# Patient Record
Sex: Female | Born: 1940 | Race: White | Hispanic: No | Marital: Married | Smoking: Never smoker
Health system: Southern US, Community
[De-identification: ages and names within clinical notes are randomized; demographics above are authoritative.]

## PROBLEM LIST (undated history)

## (undated) DIAGNOSIS — Z950 Presence of cardiac pacemaker: Secondary | ICD-10-CM

## (undated) DIAGNOSIS — N200 Calculus of kidney: Secondary | ICD-10-CM

## (undated) DIAGNOSIS — I4891 Unspecified atrial fibrillation: Secondary | ICD-10-CM

## (undated) DIAGNOSIS — I493 Ventricular premature depolarization: Secondary | ICD-10-CM

## (undated) DIAGNOSIS — R55 Syncope and collapse: Secondary | ICD-10-CM

## (undated) DIAGNOSIS — L989 Disorder of the skin and subcutaneous tissue, unspecified: Secondary | ICD-10-CM

## (undated) DIAGNOSIS — I4510 Unspecified right bundle-branch block: Secondary | ICD-10-CM

## (undated) DIAGNOSIS — I10 Essential (primary) hypertension: Secondary | ICD-10-CM

## (undated) DIAGNOSIS — F419 Anxiety disorder, unspecified: Secondary | ICD-10-CM

## (undated) DIAGNOSIS — I495 Sick sinus syndrome: Secondary | ICD-10-CM

## (undated) DIAGNOSIS — M1990 Unspecified osteoarthritis, unspecified site: Secondary | ICD-10-CM

## (undated) HISTORY — DX: Unspecified osteoarthritis, unspecified site: M19.90

## (undated) HISTORY — DX: Unspecified atrial fibrillation: I48.91

## (undated) HISTORY — DX: Syncope and collapse: R55

## (undated) HISTORY — DX: Disorder of the skin and subcutaneous tissue, unspecified: L98.9

## (undated) HISTORY — DX: Ventricular premature depolarization: I49.3

## (undated) HISTORY — PX: TONSILLECTOMY: SUR1361

## (undated) HISTORY — DX: Essential (primary) hypertension: I10

## (undated) HISTORY — DX: Calculus of kidney: N20.0

## (undated) HISTORY — DX: Anxiety disorder, unspecified: F41.9

## (undated) HISTORY — DX: Unspecified right bundle-branch block: I45.10

## (undated) HISTORY — PX: COLONOSCOPY: SHX174

---

## 2009-05-30 ENCOUNTER — Emergency Department (HOSPITAL_COMMUNITY): Admission: EM | Admit: 2009-05-30 | Discharge: 2009-05-30 | Payer: Self-pay | Admitting: Emergency Medicine

## 2009-08-10 ENCOUNTER — Ambulatory Visit: Payer: Self-pay | Admitting: Internal Medicine

## 2009-11-02 ENCOUNTER — Ambulatory Visit: Payer: Self-pay | Admitting: Internal Medicine

## 2010-03-12 ENCOUNTER — Ambulatory Visit: Payer: Self-pay | Admitting: Internal Medicine

## 2010-12-02 LAB — URINALYSIS, ROUTINE W REFLEX MICROSCOPIC
Bilirubin Urine: NEGATIVE
Glucose, UA: NEGATIVE mg/dL
Hgb urine dipstick: NEGATIVE
Specific Gravity, Urine: 1.009 (ref 1.005–1.030)
Urobilinogen, UA: 0.2 mg/dL (ref 0.0–1.0)

## 2010-12-02 LAB — BASIC METABOLIC PANEL
BUN: 20 mg/dL (ref 6–23)
Calcium: 8.5 mg/dL (ref 8.4–10.5)
Chloride: 107 mEq/L (ref 96–112)
Creatinine, Ser: 1.09 mg/dL (ref 0.4–1.2)
GFR calc Af Amer: >60 mL/min (ref >60)

## 2010-12-02 LAB — CROSSMATCH

## 2010-12-02 LAB — URINE MICROSCOPIC-ADD ON

## 2010-12-02 LAB — POCT CARDIAC MARKERS
CKMB, poc: 1.6 ng/mL (ref 1.0–8.0)
Myoglobin, poc: 108 ng/mL (ref 12–200)
Troponin i, poc: <0.05 ng/mL (ref 0.00–0.09)

## 2010-12-02 LAB — DIFFERENTIAL
Basophils Relative: 0 % (ref 0–1)
Lymphocytes Relative: 17 % (ref 12–46)
Lymphs Abs: 2 10*3/uL (ref 0.7–4.0)
Monocytes Absolute: 1 10*3/uL (ref 0.1–1.0)

## 2010-12-02 LAB — CBC
Platelets: 223 10*3/uL (ref 150–400)
RDW: 12.5 % (ref 11.5–15.5)
WBC: 12.1 10*3/uL — ABNORMAL HIGH (ref 4.0–10.5)

## 2010-12-02 LAB — ABO/RH: ABO/RH(D): A POS

## 2010-12-02 LAB — BRAIN NATRIURETIC PEPTIDE: Pro B Natriuretic peptide (BNP): 128 pg/mL — ABNORMAL HIGH (ref 0.0–100.0)

## 2011-01-11 NOTE — Letter (Signed)
March 12, 2010    Dr. Duffy Rhody  [address]  [place], [place]  [address]   RE:  Emily, [name]  MRN:  [id number]  /  DOB:  04/05/1941   Dear Peyton Najjar:   I hope this letter finds you well.  Emily [name] is seen today in  follow-up for palpitations which are presumed to be attributable to the  PVCs as well as a history of atrial fibrillation and hypertension.   She has been doing exceedingly well.  She has had very few palpitations.  Her blood pressure is much better at home running about 130.  One day  she had it 110, and she felt a little bit sluggish.  The regime change  at her last visit was to introduce hydrochlorothiazide.  We also sought  to reduce her Atenolol and put her on diltiazem.  She did not undertake  the latter, so she remains on atenolol 50 b.i.d., lisinopril HCTZ at  12.5, and she used diltiazem 30 p.r.n. for recurrent palpitations.  She  is also on aspirin.   EXAMINATION:  Today her blood pressure is a little higher at 140/80.  Her weight was 153.  Her pulse was 62.  Her neck veins were flat.  LUNGS:  Clear.  HEART:  Sounds were regular with an S4.  ABDOMEN:  Soft.  EXTREMITIES:  Without edema.   IMPRESSION:  1. Atrial fibrillation.  2. Hypertension.  3. 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  4. Thromboembolic risk factors notable for age, gender, and      hypertension.   DISCUSSION:  Emily [name] is doing well.  I am glad you checked her  blood work because her potassium needs to be reviewed since the  introduction of the diuretic.  I have advised her to take her atenolol  once a day in the event that she continues to have problems with  relatively low a.m. blood pressure and to take it in the evening.   The issue of oral anticoagulation therapy for thromboembolic risk  reduction was addressed previously, and at that point she remains  reluctant to do that.  We will plan to review this in 6 months, and we  will address that again.   Thanks for allowing [place]  to participate in her care.    Sincerely,      Duke Salvia, MD, [facility]  Electronically Signed    SCK/MedQ  DD: 03/12/2010  DT: 03/12/2010  Job #: [id number]

## 2011-01-11 NOTE — Letter (Signed)
August 10, 2009    Sharyne Peach, MD  [address]  [place], [place]  [address]   RE:  Emily [name], Emily [name]  MRN:  [id number]  /  DOB:  1941/01/15   Dear Dr. Steva Ready,   I hope this letter finds you and your family well at Christmas time.   It was a pleasure seeing Emily [name] at your request (and hers) for  recurrent atrial arrhythmias.   She is a 70 year old woman who has a history of tachy palpitations that  she says dates back about 50 years.  She thinks that maybe they were  regular in the beginning but over the last 5-10 years, she has had  recurrent irregular palpitations.  At some point, somebody told her that  she had atrial fibrillation although I do not know at what point it was  documented until the emergency room just about 2 months ago.  I was able  to track down these electrocardiograms and review them and indeed, she  had atrial fibrillation initially with a rapid rate and then with a more  controlled rate.   She has had recurrent episodes of these tachy palpitations as well as  something else called spells.  It is not clear whether they are the  same thing.  They are associated with some lightheadedness and some  sensation of fluttering but no change in her exercise tolerance.  There  was no shortness of breath, lightheadedness, or chest discomfort.   A couple of years ago, she was not sure of the date when this was first  identified, I think it was about 2003, she ended up seeing [facility] and had a stress test and an echo, both of which she said  were normal.  Again, those data are not available.   Laboratories that you drew that she brought with her today demonstrated  an HDL of 38, a triglyceride of 187, and an LDL of 92.  These labs were  drawn at 1430, so I do not think they were fasting.   PAST MEDICAL HISTORY:  In addition to the above is notable for anxiety  and hypertension.   Her thromboembolic risk factors are notable for age, hypertension,  and  gender.   PAST SURGICAL HISTORY:  Negative.   REVIEW OF SYSTEMS:  Broadly negative across multiple organ systems apart  from what is listed above.   MEDICATIONS:  1. Atenolol 50 b.i.d.  2. Lisinopril 30.  3. Diltiazem 30 p.r.n.  4. Lorazepam 1 b.i.d.  5. Aspirin 325.  She notes that she has been previously spoken to about using Coumadin;  she had declined in the past.  She also has a prescription for once a  day diltiazem, which she is not taking.   She is allergic to PENICILLIN.   PHYSICAL EXAMINATION:  GENERAL:  She is an older Caucasian female  appearing her stated age of 49 although she may look somewhat younger  than this.  She is in no acute distress.  VITAL SIGNS:  Her blood pressure was 161/84, pulse was 73, and her  weight was 158.  HEENT:  Normal.  NECK:  There is no lymphadenopathy.  Carotids are brisk and full  bilaterally without bruits; neck veins were flat.  BACK:  Without kyphosis or scoliosis.  LUNGS:  Clear.  There is no CVA tenderness.  HEART:  Sounds were regular with an S4.  There was an early systolic  murmur.  ABDOMEN:  Soft with active bowel sounds  without midline pulsation or  hepatomegaly.  EXTREMITIES:  Femoral pulses were 2+.  Distal pulses were intact.  There  was no clubbing, cyanosis, or edema.  NEUROLOGIC:  Grossly normal.  SKIN:  Warm and dry.   I should note also that her family history is noncontributory.   SOCIAL HISTORY:  She continues to work at [employer].  She  is married.  She has 3 children, 9 grandchildren, 2 great-grandchildren.  She smokes but does not currently.  She drinks occasional wine and uses  some caffeine.   Electrocardiogram obtained today demonstrated a sinus rhythm at 77 with  intervals of 0.16/0.11/0.38.  The axis was mildly leftward at -6.   IMPRESSION:  1. Paroxysmal atrial fibrillation.  2. Thromboembolic risk factors notable for;      a.     Age.      b.     Gender.      c.      Hypertension.  3. Incomplete right bundle-branch block.   Dr. Steva Ready, Ms. Slappey has paroxysmal atrial fibrillation.  It is not  all that bothersome to her except that it has been disconcerting.  We  discussed management of symptoms and your p.r.n. diltiazem has had a big  beneficial impact on this.   I think the biggest issue is thromboembolic risk reduction.  Based on  the CHADS-VASc algorithm, she has a score of 3 (hypertension - 1, age -  1, gender - 1).  As such, I would recommend that she be on Coumadin.  Alternatively, she could be placed on dabigatran.  She is not yet ready  to pursue oral anticoagulation.   She did ask about catheter ablation.  I told that her it was  appropriately reserved for people who have failed antiarrhythmic drug  therapy given the risk of recurrence and not inconsequential risk  profile.  Further anticoagulation would be an important part of that  therapeutic pursuit.   We did decide to revisit this problem in 3 months.  She is to consider  oral anticoagulation in the interim.  She will continue to use her  diltiazem p.r.n. in conjunction with her atenolol, and I did refill her  atenolol, lisinopril prescriptions.    Sincerely,      Duke Salvia, MD, [facility]  Electronically Signed    SCK/MedQ  DD: 08/10/2009  DT: 08/11/2009  Job #: [id number]

## 2011-01-11 NOTE — Letter (Signed)
November 02, 2009    Dr. Brent Bulla  [address]  [place], [place] [address]   RE:  Emily, [name]  MRN:  [id number]  /  DOB:  April 20, 1941   Dear Dr. Marina Goodell:   Welcome to the [place] area.  Hopefully, it has been an easy move.  [name] came back in for her palpitations which were previously  identified and did have some atrial fibrillation.  She has a  longstanding history of palpitations so undoubtedly there are 2  different mechanisms.   She has had some of this since I saw her last a couple of months ago.  These were rapidly quieted by p.r.n. diltiazem.   She has noted also that her blood pressures are quite elevated at 160 or  so in the morning.  She, on her own, increased her lisinopril from 20 to  40 mg a day.  Her other medications include atenolol 50 b.i.d., aspirin,  diltiazem, and fish oil.   EXAMINATION:  Her blood pressure is 167/87.  Her pulse was 77.  Her  weight was 155, down 3 pounds.  LUNGS:  Clear.  NECK:  Veins were flat.  Her carotids were brisk.  HEART:  Sounds were regular.  EXTREMITIES:  Without edema.   IMPRESSION:  1. Hypertension.  2. Atrial fibrillation.  3. Palpitations with skips, probably premature ventricular      contractions.   We had a lengthy discussion regarding salt and the importance of  decreasing salt intake.  We also had a discussion regarding treatment  options for her atrial fibrillation and her hypertension and other  palpitations.   What we have elected to do was to resume her lisinopril 20 mg a day and  add hydrochlorothiazide at 12.5 mg a day to this.  In addition, because  of the apparent benefit of the calcium blocker, we will begin to take it  daily at 120 mg a day and decrease her atenolol at the same time from 50  mg twice daily to once a day.   She will work further on decreasing her salt intake.   We will see her again in 4 months.  If there is anything I can do,  please do not hesitate to contact me.    Sincerely,      Duke Salvia, MD, [facility]  Electronically Signed    SCK/MedQ  DD: 11/02/2009  DT: 11/02/2009  Job #: [id number]

## 2011-02-10 ENCOUNTER — Encounter: Payer: Self-pay | Admitting: Internal Medicine

## 2011-02-14 ENCOUNTER — Ambulatory Visit (INDEPENDENT_AMBULATORY_CARE_PROVIDER_SITE_OTHER): Payer: 59 | Admitting: Internal Medicine

## 2011-02-14 DIAGNOSIS — I4949 Other premature depolarization: Secondary | ICD-10-CM

## 2011-02-14 DIAGNOSIS — I4891 Unspecified atrial fibrillation: Secondary | ICD-10-CM

## 2011-02-15 ENCOUNTER — Other Ambulatory Visit: Payer: Self-pay | Admitting: *Deleted

## 2011-02-15 DIAGNOSIS — I4891 Unspecified atrial fibrillation: Secondary | ICD-10-CM

## 2011-02-15 DIAGNOSIS — R002 Palpitations: Secondary | ICD-10-CM

## 2011-02-15 NOTE — Letter (Signed)
February 14, 2011   Duffy Rhody, MD [address] [place], [place] [address]  RE:  Emily [name], Emily [name] MRN:  [id number]  /  DOB:  [birth year]  Dear Peyton Najjar:  I hope this letter finds you well.  [name] is seen today in followup for palpitations which are presumed to be PVCs as well as a history of atrial fibrillation in the context of hypertension.  She continues to do very well.  She has occasional palpitations.  He has learned just to relax and then pass which they do within a minute or two.  She continues to be very fit.  She pushes her long more 4 hours a day in the sun without problems of chest pain or shortness of breath.  Her medications currently include atenolol 50 b.i.d., aspirin 325, diltiazem 30 p.r.n., hydrochlorothiazide 12.5, lisinopril 30.  On examination today, her blood pressure was 133/72, her pulse was 80. Her lungs were clear.  Heart sounds were regular.  Neck veins were flat. The carotids are brisk.  The extremities were without edema.  She is alert and oriented.  The skin looks warm and dry.  Peripheral electrocardiogram demonstrated today demonstrated sinus rhythm with right bundle-branch block with intervals of 0.17/0.11/0.40. Otherwise normal.  IMPRESSION: 1. Atrial fibrillation. 2. Thromboembolic risk factors notable for age, gender and     hypertension. 3. Premature ventricular contractions.  [name] is doing really very well.  I have told her that in fall we expect approval of the apixaban.  The advantage of this is that it is risk profile was quite similar to aspirin, but its benefit profile is greater than warfarin.  She is agreeable to thinking about that.   Sincerely,     Duke Salvia, MD, [facility]   SCK/MedQ  DD: 02/14/2011  DT: 02/15/2011  Job #: [id number]

## 2011-02-15 NOTE — Letter (Deleted)
February 14, 2011   Peyton Najjar A. Freida Busman, MD [address], [address] [place], [place] [address]  RE:  Emily [name], Emily [name] MRN:  [id number]  /  DOB:  1941-08-28  Dear Peyton Najjar:  I hope this letter finds you well.  [name] is seen today in followup for palpitations which are presumed to be PVCs as well as a history of atrial fibrillation in the context of hypertension.  She continues to do very well.  She has occasional palpitations.  He has learned just to relax and then pass which they do within a minute or two.  She continues to be very fit.  She pushes her long more 4 hours a day in the sun without problems of chest pain or shortness of breath.  Her medications currently include atenolol 50 b.i.d., aspirin 325, diltiazem 30 p.r.n., hydrochlorothiazide 12.5, lisinopril 30.  On examination today, her blood pressure was 133/72, her pulse was 80. Her lungs were clear.  Heart sounds were regular.  Neck veins were flat. The carotids are brisk.  The extremities were without edema.  She is alert and oriented.  The skin looks warm and dry.  Peripheral electrocardiogram demonstrated today demonstrated sinus rhythm with right bundle-branch block with intervals of 0.17/0.11/0.40. Otherwise normal.  IMPRESSION: 1. Atrial fibrillation. 2. Thromboembolic risk factors notable for age, gender and     hypertension. 3. Premature ventricular contractions.  [name] is doing really very well.  I have told her that in fall we expect approval of the apixaban.  The advantage of this is that it is risk profile was quite similar to aspirin, but its benefit profile is greater than warfarin.  She is agreeable to think about that.   Sincerely,     Duke Salvia, MD, [facility]   SCK/MedQ  DD: 02/14/2011  DT: 02/15/2011  Job #: [id number]

## 2011-03-11 ENCOUNTER — Encounter: Payer: Self-pay | Admitting: Cardiovascular Disease

## 2011-04-29 ENCOUNTER — Other Ambulatory Visit: Payer: Self-pay | Admitting: Internal Medicine

## 2011-09-02 ENCOUNTER — Encounter: Payer: Self-pay | Admitting: Internal Medicine

## 2011-09-02 ENCOUNTER — Ambulatory Visit (INDEPENDENT_AMBULATORY_CARE_PROVIDER_SITE_OTHER): Payer: Medicare Other | Admitting: Internal Medicine

## 2011-09-02 DIAGNOSIS — I10 Essential (primary) hypertension: Secondary | ICD-10-CM | POA: Insufficient documentation

## 2011-09-02 DIAGNOSIS — I4949 Other premature depolarization: Secondary | ICD-10-CM

## 2011-09-02 DIAGNOSIS — I493 Ventricular premature depolarization: Secondary | ICD-10-CM | POA: Insufficient documentation

## 2011-09-02 DIAGNOSIS — I4510 Unspecified right bundle-branch block: Secondary | ICD-10-CM | POA: Insufficient documentation

## 2011-09-02 DIAGNOSIS — I4891 Unspecified atrial fibrillation: Secondary | ICD-10-CM | POA: Insufficient documentation

## 2011-09-02 NOTE — Progress Notes (Signed)
  HPI  Emily [name] is a 71 y.o. female Seen in followup for documented atrial fibrillation (ECGs I reviewed from [facility] 2010) and symptomatic PVCs. She continues to have infrequent but recurring episodes of atrial fibrillation. She takes a  Cardizem or 2 with a subsequent reversion to regular rhythm  Her thromboembolic risk profile includes hypertension gender and age; she has been reluctant to pursue oral i anticoagulation  Past Medical History  Diagnosis Date  . Campath-induced atrial fibrillation   . HTN (hypertension)   . Palpitations   . PVC's (premature ventricular contractions)     Past Surgical History  Procedure Date  . None     Current Outpatient Prescriptions  Medication Sig Dispense Refill  . aspirin 325 MG tablet Take 325 mg by mouth daily.        Marland Kitchen atenolol (TENORMIN) 50 MG tablet Take 50 mg by mouth 2 (two) times daily.       Marland Kitchen diltiazem (CARDIZEM) 30 MG tablet Take 30 mg by mouth as needed.        . hydrochlorothiazide (,MICROZIDE/HYDRODIURIL,) 12.5 MG capsule Take 12.5 mg by mouth daily.        Marland Kitchen lisinopril (PRINIVIL,ZESTRIL) 30 MG tablet Take 30 mg by mouth daily.        Marland Kitchen LORazepam (ATIVAN) 1 MG tablet Take 1 mg by mouth 2 (two) times daily.        . Multiple Vitamin (MULTIVITAMIN) tablet Take 1 tablet by mouth daily.        . Multiple Vitamins-Minerals (EYE VITAMINS) CAPS Take by mouth daily.          Allergies  Allergen Reactions  . Penicillins     Review of Systems negative except from HPI and PMH  Physical Exam Well developed and well nourished in no acute distress HENT normal E scleral and icterus clear Neck Supple JVP flat; carotids brisk and full Clear to ausculation regular rate and rhythm Regular rate and rhythm, no murmurs gallops or rub Soft with active bowel sounds No clubbing cyanosis none Edema Alert and oriented, grossly normal motor and sensory function Skin Warm and Dry  ECG demonstrates sinus rhythm at  66 Intervals 0.17/0.12/0.44 There is right bundle branch block and modest ST segment changes in the inferior leads  Assessment and  Plan

## 2011-09-02 NOTE — Assessment & Plan Note (Addendum)
The patient has paroxysmal atrial fibrillation. We have again reviewed the data regarding oral anticoagulation. The new drug, Apixoban, has just been released. She will consider her transitioning from aspirin . She is not yet ready. In the interim I've asked her to decrease her aspirin from 325-81.  We discussed risks and benefits related to bleeding and stroke risk reduction.

## 2011-09-02 NOTE — Patient Instructions (Signed)
Your physician wants you to follow-up in: 6 months with Dr. Graciela Husbands. You will receive a reminder letter in the mail two months in advance. If you don't receive a letter, please call our office to schedule the follow-up appointment.  Your physician has recommended you make the following change in your medication:  1) Decrease aspirin to 81 mg once daily.

## 2011-09-02 NOTE — Assessment & Plan Note (Signed)
Well- controlled on current medication 

## 2012-03-20 ENCOUNTER — Ambulatory Visit (INDEPENDENT_AMBULATORY_CARE_PROVIDER_SITE_OTHER): Payer: Medicare Other | Admitting: Internal Medicine

## 2012-03-20 ENCOUNTER — Encounter: Payer: Self-pay | Admitting: Internal Medicine

## 2012-03-20 VITALS — BP 152/78 | HR 56 | Ht 67.0 in | Wt 157.0 lb

## 2012-03-20 DIAGNOSIS — I493 Ventricular premature depolarization: Secondary | ICD-10-CM

## 2012-03-20 DIAGNOSIS — I4891 Unspecified atrial fibrillation: Secondary | ICD-10-CM

## 2012-03-20 DIAGNOSIS — I10 Essential (primary) hypertension: Secondary | ICD-10-CM

## 2012-03-20 DIAGNOSIS — I4949 Other premature depolarization: Secondary | ICD-10-CM

## 2012-03-20 MED ORDER — ASPIRIN 81 MG PO TABS: 81.0000 mg | ORAL_TABLET | Freq: Every day | ORAL | Status: DC

## 2012-03-20 NOTE — Assessment & Plan Note (Signed)
Continue current beta blockers

## 2012-03-20 NOTE — Patient Instructions (Addendum)
Your physician wants you to follow-up in: YEAR  WITH  DR Logan Bores will receive a reminder letter in the mail two months in advance. If you don't receive a letter, please call our office to schedule the follow-up appointment. Your physician has recommended you make the following change in your medication: DECREASE  ASA  TO 81 MG

## 2012-03-20 NOTE — Progress Notes (Signed)
  HPI  Emily [name] is a 71 y.o. female Seen in followup for atrial fibrillation and PVCs. She has declined anticoagulation and not withstanding her CHADS-VASc score of 3  Her energy level is unabated. He does take her 6 hours now is that of 5 hours smoker long.  Past Medical History  Diagnosis Date  . Atrial fibrillation   . HTN (hypertension)   . Palpitations   . PVC's (premature ventricular contractions)   . Right bundle branch block     Past Surgical History  Procedure Date  . None     Current Outpatient Prescriptions  Medication Sig Dispense Refill  . aspirin 325 MG tablet Take 325 mg by mouth daily.        Marland Kitchen atenolol (TENORMIN) 50 MG tablet Take 50 mg by mouth 2 (two) times daily.       Marland Kitchen diltiazem (CARDIZEM) 30 MG tablet Take 30 mg by mouth as needed.        . hydrochlorothiazide (,MICROZIDE/HYDRODIURIL,) 12.5 MG capsule Take 12.5 mg by mouth 4 (four) times a week.       Marland Kitchen lisinopril (PRINIVIL,ZESTRIL) 30 MG tablet Take 30 mg by mouth daily.        Marland Kitchen LORazepam (ATIVAN) 1 MG tablet Take 1 mg by mouth 2 (two) times daily.        . Multiple Vitamin (MULTIVITAMIN) tablet Take 1 tablet by mouth daily.        . Multiple Vitamins-Minerals (EYE VITAMINS) CAPS Take by mouth daily.          Allergies  Allergen Reactions  . Penicillins     Review of Systems negative except from HPI and PMH  Physical Exam BP 152/78  Pulse 56  Ht 5\' 7"  (1.702 m)  Wt 157 lb (71.215 kg)  BMI 24.59 kg/m2 Well developed and well nourished in no acute distress HENT normal E scleral and icterus clear Neck Supple JVP flat;  Clear to ausculation Regular rate and rhythm, no murmurs gallops or rub Soft with active bowel sounds No clubbing cyanosis none Edema Alert and oriented, grossly normal motor and sensory function Skin Warm and Dry  Electrocardiogram demonstrates sinus rhythm at 56 Interval 17/12/41 Axis VIII Right bundle branch block  Assessment and  Plan

## 2012-03-20 NOTE — Assessment & Plan Note (Signed)
No clinical recurrences of atrial fibrillation. We again discussed anticoagulation; she   remains adamant about not using alternative anticoagulation; she is agreeable to decrease her aspirin. I recommended that she gofrom #[id number]

## 2012-03-20 NOTE — Assessment & Plan Note (Addendum)
Blood pressure is elevated. She says this morning at home however, it was 125. I will defer this to Dr.Peerry

## 2012-04-16 ENCOUNTER — Encounter (INDEPENDENT_AMBULATORY_CARE_PROVIDER_SITE_OTHER): Payer: Medicare Other

## 2012-04-16 DIAGNOSIS — R002 Palpitations: Secondary | ICD-10-CM

## 2012-06-07 ENCOUNTER — Telehealth: Payer: Self-pay | Admitting: Internal Medicine

## 2012-06-07 NOTE — Telephone Encounter (Signed)
PT REQUESTING MONITOR RESULTS

## 2012-10-24 ENCOUNTER — Other Ambulatory Visit (HOSPITAL_COMMUNITY): Payer: Self-pay | Admitting: Cardiovascular Disease

## 2012-10-24 DIAGNOSIS — I4891 Unspecified atrial fibrillation: Secondary | ICD-10-CM

## 2012-10-24 DIAGNOSIS — R002 Palpitations: Secondary | ICD-10-CM

## 2012-10-24 DIAGNOSIS — I119 Hypertensive heart disease without heart failure: Secondary | ICD-10-CM

## 2012-11-02 ENCOUNTER — Ambulatory Visit (HOSPITAL_COMMUNITY): Payer: Medicare Other

## 2012-11-07 ENCOUNTER — Ambulatory Visit (HOSPITAL_COMMUNITY): Payer: Medicare Other

## 2012-11-20 ENCOUNTER — Ambulatory Visit (HOSPITAL_COMMUNITY)
Admission: RE | Admit: 2012-11-20 | Discharge: 2012-11-20 | Disposition: A | Discharge status: Home / Self Care | Payer: Medicare Other | Source: Ambulatory Visit | Attending: Cardiovascular Disease | Admitting: Cardiovascular Disease

## 2012-11-20 DIAGNOSIS — I079 Rheumatic tricuspid valve disease, unspecified: Secondary | ICD-10-CM | POA: Insufficient documentation

## 2012-11-20 DIAGNOSIS — I379 Nonrheumatic pulmonary valve disorder, unspecified: Secondary | ICD-10-CM | POA: Insufficient documentation

## 2012-11-20 DIAGNOSIS — I359 Nonrheumatic aortic valve disorder, unspecified: Secondary | ICD-10-CM | POA: Insufficient documentation

## 2012-11-20 DIAGNOSIS — R002 Palpitations: Secondary | ICD-10-CM

## 2012-11-20 DIAGNOSIS — I059 Rheumatic mitral valve disease, unspecified: Secondary | ICD-10-CM | POA: Insufficient documentation

## 2012-11-20 DIAGNOSIS — I2789 Other specified pulmonary heart diseases: Secondary | ICD-10-CM | POA: Insufficient documentation

## 2012-11-20 DIAGNOSIS — I119 Hypertensive heart disease without heart failure: Secondary | ICD-10-CM

## 2012-11-20 DIAGNOSIS — I4891 Unspecified atrial fibrillation: Secondary | ICD-10-CM

## 2012-11-20 NOTE — Progress Notes (Signed)
2D Echo Performed 11/20/2012    Omkar Stratmann, RCS  

## 2013-03-10 ENCOUNTER — Encounter: Payer: Self-pay | Admitting: *Deleted

## 2013-03-11 ENCOUNTER — Encounter: Payer: Self-pay | Admitting: Cardiovascular Disease

## 2013-03-12 ENCOUNTER — Encounter: Payer: Self-pay | Admitting: Cardiovascular Disease

## 2013-03-12 ENCOUNTER — Ambulatory Visit (INDEPENDENT_AMBULATORY_CARE_PROVIDER_SITE_OTHER): Payer: Medicare Other | Admitting: Cardiovascular Disease

## 2013-03-12 VITALS — BP 132/80 | HR 55 | Ht 67.0 in | Wt 145.0 lb

## 2013-03-12 DIAGNOSIS — I4510 Unspecified right bundle-branch block: Secondary | ICD-10-CM

## 2013-03-12 DIAGNOSIS — I4949 Other premature depolarization: Secondary | ICD-10-CM

## 2013-03-12 DIAGNOSIS — I493 Ventricular premature depolarization: Secondary | ICD-10-CM

## 2013-03-12 DIAGNOSIS — I4891 Unspecified atrial fibrillation: Secondary | ICD-10-CM

## 2013-03-12 DIAGNOSIS — I10 Essential (primary) hypertension: Secondary | ICD-10-CM

## 2013-03-12 MED ORDER — DILTIAZEM HCL 30 MG PO TABS: 30.0000 mg | ORAL_TABLET | ORAL | Status: DC | PRN

## 2013-03-12 NOTE — Patient Instructions (Signed)
Your physician recommends that you schedule a follow-up appointment in: 6 months  

## 2013-03-12 NOTE — Progress Notes (Signed)
Patient ID: Emily [name], female   DOB: [birth year], 72 y.o.   MRN: [id number]     HPI: Emily [name], is a 72 y.o. female who presents to the office to for cardiology followup evaluation.  Emily [name] is a very pleasant 72 year old female who has a history of PVCs as well as paroxysmal atrial fibrillation. She has documented right bundle branch block. I saw her for initial cardiology evaluation 10/23/2012 and at that time she was taking up to 5 diltiazem today the patient's and was on atenolol 50 twice a day. She was hypertensive and I titrated her lisinopril to 40 mg and increased her atenolol to 75 twice a day. An echo Doppler study showed normal systolic function with grade 2 diastolic dysfunction. She did have mild MR, moderate TR, mild to moderate pulmonary hypertension, as well as mild aortic sclerosis with mild aortic insufficiency. I had seen her last on 11/29/2012 time her blood pressure was still elevated. She was not having signs of edema. I kept her off her hydrochlorothiazide and further titrate her atenolol to 100 mg in the morning and 75 mg at night. I recommended that if the palpitations continue she could increase this to 100 mg twice a day to  Emily [name] has felt exceptionally well. She is unaware of any palpitations. She has not required any supplemental Cardizem administration. She presents for evaluation. She tells me her primary care physician Dr. Brent Bulla will be checking her laboratory.  Past Medical History  Diagnosis Date  . Palpitations   . Atrial fibrillation   . PVC's (premature ventricular contractions)   . Right bundle branch block   . HTN (hypertension)     11/20/12 -ECHO  EF 55-60%    Past Surgical History  Procedure Laterality Date  . None      Allergies  Allergen Reactions  . Penicillins     Current Outpatient Prescriptions  Medication Sig Dispense Refill  . aspirin 81 MG tablet Take 1 tablet (81 mg total) by mouth daily.      Marland Kitchen  atenolol (TENORMIN) 50 MG tablet Take 50 mg by mouth. 100 MG IN THE AM AND 75 MG IN THE PM.      . diltiazem (CARDIZEM) 30 MG tablet Take 30 mg by mouth as needed.        Marland Kitchen lisinopril (PRINIVIL,ZESTRIL) 40 MG tablet Take 40 mg by mouth daily.      Marland Kitchen LORazepam (ATIVAN) 1 MG tablet Take 1 mg by mouth 2 (two) times daily.         No current facility-administered medications for this visit.    Socially she is married has 3 children, 9 grandchildren and 2 great-grandchildren. She has been active over the summer months and typically progress in the backyard and tobacco use. He does drink occasional alcohol  ROS is negative for fevers, chills or night sweats. She denies PND or orthopnea. She denies visual changes. She denies wheezing. She denies presyncope or syncope. There is no chest pressure. She denies bleeding. She denies abdominal pain. There is no nausea or vomiting. She denies myalgias. She denies edema.  Other system review is negative.  PE BP 132/80  Pulse 55  Ht 5\' 7"  (1.702 m)  Wt 145 lb (65.772 kg)  BMI 22.71 kg/m2  General: Alert, oriented, no distress.  Skin: normal turgor, no rashes HEENT: Normocephalic, atraumatic. Pupils round and reactive; sclera anicteric;no lid lag.  Nose without nasal septal hypertrophy Mouth/Parynx benign; Mallinpatti scale 2 Neck:  No JVD, no carotid briuts Lungs: clear to ausculatation and percussion; no wheezing or rales Heart: RRR, s1 s2 normal 1/6 SEM Abdomen: soft, nontender; no hepatosplenomehaly, BS+; abdominal aorta nontender and not dilated by palpation. Pulses 2+ Extremities: no clubbing cyanosis or edema, Homan's sign negative  Neurologic: grossly nonfocal  ECG: Sinus rhythm at 55 beats per minute with right bundle branch block and repolarization changes. One isolated premature atrial contraction.  LABS:  BMET    Component Value Date/Time   NA 139 05/30/2009 1741   K 4.0 05/30/2009 1741   CL 107 05/30/2009 1741   CO2 26 05/30/2009 1741     GLUCOSE 142* 05/30/2009 1741   BUN 20 05/30/2009 1741   CREATININE 1.09 05/30/2009 1741   CALCIUM 8.5 05/30/2009 1741   GFRNONAA 50* 05/30/2009 1741   GFRAA  Value: >60        The eGFR has been calculated using the MDRD equation. This calculation has not been validated in all clinical situations. eGFR's persistently <60 mL/min signify possible Chronic Kidney Disease. 05/30/2009 1741     Hepatic Function Panel  No results found for this basename: prot, albumin, ast, alt, alkphos, bilitot, bilidir, ibili     CBC    Component Value Date/Time   WBC 12.1* 05/30/2009 1741   RBC 4.64 05/30/2009 1741   HGB 14.7 05/30/2009 1741   HCT 43.4 05/30/2009 1741   PLT 223 05/30/2009 1741   MCV 93.5 05/30/2009 1741   MCHC 33.8 05/30/2009 1741   RDW 12.5 05/30/2009 1741   LYMPHSABS 2.0 05/30/2009 1741   MONOABS 1.0 05/30/2009 1741   EOSABS 0.0 05/30/2009 1741   BASOSABS 0.0 05/30/2009 1741     BNP    Component Value Date/Time   PROBNP 128.0* 05/30/2009 1741    Lipid Panel  No results found for this basename: chol, trig, hdl, cholhdl, vldl, ldlcalc     RADIOLOGY: No results found.    ASSESSMENT AND PLAN: [name] feels improved and seems to be tolerating her increased dose of atenolol at 100 mg in the morning and 75 mg in the evening. Her blood pressure is controlled. He is not requiring any breakthrough Cardizem. She'll have laboratory done by Dr. Marina Goodell. If he does note increased palpitation she can increase her atenolol to 100 twice a day. She has expired dose of her Cardizem. I renewed his prescription today to take 30 mg of diltiazem on a PRN basis. As long as she remains stable I will see her in 6 months for followup reassessment.     Lennette Bihari, MD, [facility]  03/12/2013 11:18 AM

## 2013-04-19 ENCOUNTER — Other Ambulatory Visit: Payer: Self-pay | Admitting: *Deleted

## 2013-04-19 MED ORDER — ATENOLOL 50 MG PO TABS: ORAL_TABLET | ORAL | Status: DC

## 2013-04-19 NOTE — Telephone Encounter (Signed)
Rx was sent to pharmacy electronically. 

## 2013-05-30 ENCOUNTER — Other Ambulatory Visit: Payer: Self-pay | Admitting: Cardiovascular Disease

## 2013-05-30 NOTE — Telephone Encounter (Signed)
Rx was sent to pharmacy electronically. 

## 2013-09-25 ENCOUNTER — Encounter: Payer: Self-pay | Admitting: Cardiovascular Disease

## 2013-09-25 ENCOUNTER — Ambulatory Visit (INDEPENDENT_AMBULATORY_CARE_PROVIDER_SITE_OTHER): Payer: Medicare Other | Admitting: Cardiovascular Disease

## 2013-09-25 VITALS — BP 166/90 | HR 56 | Ht 67.0 in | Wt 144.0 lb

## 2013-09-25 DIAGNOSIS — I4510 Unspecified right bundle-branch block: Secondary | ICD-10-CM

## 2013-09-25 DIAGNOSIS — I4891 Unspecified atrial fibrillation: Secondary | ICD-10-CM

## 2013-09-25 DIAGNOSIS — I4949 Other premature depolarization: Secondary | ICD-10-CM

## 2013-09-25 DIAGNOSIS — I493 Ventricular premature depolarization: Secondary | ICD-10-CM

## 2013-09-25 DIAGNOSIS — I10 Essential (primary) hypertension: Secondary | ICD-10-CM

## 2013-09-25 NOTE — Progress Notes (Signed)
Patient ID: Emily [name], female   DOB: 20-Jul-1941, 73 y.o.   MRN: [id number]     HPI: Emily [name], is a 73 y.o. female who presents to the office to fora 6 month cardiology followup evaluation.  Emily [name] is a very pleasant 73 year old female who has a history of PVCs as well as paroxysmal atrial fibrillation. She has documented right bundle branch block. I saw her for initial cardiology evaluation 10/23/2012 and at that time she was taking up to 5 diltiazem today the patient's and was on atenolol 50 twice a day. She was hypertensive and I titrated her lisinopril to 40 mg and increased her atenolol to 75 twice a day. An echo Doppler study showed normal systolic function with grade 2 diastolic dysfunction. She did have mild MR, moderate TR, mild to moderate pulmonary hypertension, as well as mild aortic sclerosis with mild aortic insufficiency. I had seen her last on 11/29/2012 time her blood pressure was still elevated. She was not having signs of edema. I kept her off her hydrochlorothiazide and further titrate her atenolol to 100 mg in the morning and 75 mg at night. I recommended that if the palpitations continue she could increase this to 100 mg twice a day.   Over the past 6 months, Emily [name] has felt exceptionally well. She is unaware of any palpitations. She has not required any supplemental Cardizem administration. She recently underwent laboratory by Emily [name] on 08/28/2013. I reviewed these. Her hemoglobin was 14.2 hematocrit 40.9. Glucose was normal with a BUN of 16 creatinine 0.87. Serum glucose was 92. Had normal LFTs. Total cholesterol is 176 triglycerides 70 HDL 55 VLDL 14 and LDL calculated cholesterol 107.   She denies recent chest pain. She denies dyspnea. She presents for a six-month evaluation.  Past Medical History  Diagnosis Date  . Palpitations   . Atrial fibrillation   . PVC's (premature ventricular contractions)   . Right bundle branch block   . HTN  (hypertension)     11/20/12 -ECHO  EF 55-60%    Past Surgical History  Procedure Laterality Date  . None      Allergies  Allergen Reactions  . Penicillins     Current Outpatient Prescriptions  Medication Sig Dispense Refill  . aspirin 325 MG tablet Take 325 mg by mouth daily.      Marland Kitchen atenolol (TENORMIN) 50 MG tablet Takes 100 MG IN THE AM (2 tablets) AND 75 MG IN THE PM (1.5 tablets)  105 tablet  11  . diltiazem (CARDIZEM) 30 MG tablet Take 1 tablet (30 mg total) by mouth as needed.  30 tablet  6  . lisinopril (PRINIVIL,ZESTRIL) 40 MG tablet TAKE ONE TABLET BY MOUTH ONCE DAILY  30 tablet  9  . LORazepam (ATIVAN) 1 MG tablet Take 1 mg by mouth 3 (three) times daily.        No current facility-administered medications for this visit.    Socially she is married has 3 children, 9 grandchildren and 2 great-grandchildren. She has been active over the summer months and typically progress in the backyard and tobacco use. He does drink occasional alcohol  ROS is negative for fevers, chills or night sweats. She denies change in vision or hearing. There are no headaches. She denies cough increased sputum production. She denies PND or orthopnea. She denies wheezing. She denies presyncope or syncope. There is no chest pressure. Her palpitations have essentially resolved on her high-dose atenolol at 100 mg in the morning  and 75 mg in the evening. She denies any ACE-induced cough on lisinopril. She denies bleeding. She denies abdominal pain. There is no nausea or vomiting. There is no blood in stool or urine. She denies myalgias. She denies edema. There is no claudication. She denies difficulty with sleep. Other preventive 14 point system review is negative.  PE BP 166/90  Pulse 56  Ht _0  (1.702 m)  Wt 144 lb (65.318 kg)  BMI 22.55 kg/m2  Repeat blood pressure taken by me was 09/21/1976. General: Alert, oriented, no distress.  Skin: normal turgor, no rashes HEENT: Normocephalic, atraumatic.  Pupils round and reactive; sclera anicteric;no lid lag.  Nose without nasal septal hypertrophy Mouth/Parynx benign; Mallinpatti scale 2 Neck: No JVD, no carotid bruits with normal upstroke Chest: No tenderness to palpation Lungs: clear to ausculatation and percussion; no wheezing or rales Heart: RRR, s1 s2 normal 1/6 SEM Abdomen: soft, nontender; no hepatosplenomehaly, BS+; abdominal aorta nontender and not dilated by palpation. Back: No CVA tenderness Pulses 2+ Extremities: no clubbing cyanosis or edema, Homan's sign negative  Neurologic: grossly nonfocal Psychological: Normal affect and mood appear  ECG (independently read by me): Sinus rhythm at 56 beats per minute. Right bundle branch block with repolarization changes. No ectopy. Normal intervals.  Review of prior ECG from 03/12/2013: Sinus rhythm at 55 beats per minute with right bundle branch block and repolarization changes. One isolated premature atrial contraction.  LABS:  BMET    Component Value Date/Time   NA 139 05/30/2009 1741   K 4.0 05/30/2009 1741   CL 107 05/30/2009 1741   CO2 26 05/30/2009 1741   GLUCOSE 142* 05/30/2009 1741   BUN 20 05/30/2009 1741   CREATININE 1.09 05/30/2009 1741   CALCIUM 8.5 05/30/2009 1741   GFRNONAA 50* 05/30/2009 1741   GFRAA  Value: >60        The eGFR has been calculated using the MDRD equation. This calculation has not been validated in all clinical situations. eGFR's persistently <60 mL/min signify possible Chronic Kidney Disease. 05/30/2009 1741     Hepatic Function Panel  No results found for this basename: prot,  albumin,  ast,  alt,  alkphos,  bilitot,  bilidir,  ibili     CBC    Component Value Date/Time   WBC 12.1* 05/30/2009 1741   RBC 4.64 05/30/2009 1741   HGB 14.7 05/30/2009 1741   HCT 43.4 05/30/2009 1741   PLT 223 05/30/2009 1741   MCV 93.5 05/30/2009 1741   MCHC 33.8 05/30/2009 1741   RDW 12.5 05/30/2009 1741   LYMPHSABS 2.0 05/30/2009 1741   MONOABS 1.0 05/30/2009 1741    EOSABS 0.0 05/30/2009 1741   BASOSABS 0.0 05/30/2009 1741     BNP    Component Value Date/Time   PROBNP 128.0* 05/30/2009 1741    Lipid Panel  No results found for this basename: chol,  trig,  hdl,  cholhdl,  vldl,  ldlcalc     RADIOLOGY: No results found.    ASSESSMENT AND PLAN: Ms. Brau is a 73 year old female who has a history of PVCs as well as paroxysmal atrial fibrillation and documented chronic right bundle branch block. Presently, she denies recent change or development of abnormal heart rhythm. She seems to tolerate the increased dose of beta blocker therapy with atenolol 100 mg in the morning and 75 mg at night. Her blood pressure initially today was elevated at 166/90 but on repeat this was improved to 124/78. Her resting pulse is 56. She  seems to be tolerating lisinopril at 40 mg daily. She does take Ativan on a when necessary basis for anxiety. She does have a prescription for diltiazem 30 mg to take on an as-needed basis but over the past several months has not required this. Her weight is excellent with a body mass index of 22.55. I did review the blood work done by Emily [name] in detail. Lipid status is stable on diet without lipid lowering treatment.  I have recommended exercise to if she does no significant additional palpitations she can increase her atenolol to 100 mg twice a day. As long as she remains stable, I will see her in one year for followup evaluation.   Troy Sine, MD, [facility]  09/25/2013 11:55 AM

## 2013-09-25 NOTE — Patient Instructions (Signed)
Your physician recommends that you schedule a follow-up appointment in: 1 year  

## 2013-09-26 ENCOUNTER — Encounter: Payer: Self-pay | Admitting: Cardiovascular Disease

## 2014-03-18 ENCOUNTER — Other Ambulatory Visit: Payer: Self-pay | Admitting: *Deleted

## 2014-03-18 MED ORDER — LISINOPRIL 40 MG PO TABS: 40.0000 mg | ORAL_TABLET | Freq: Every day | ORAL | Status: DC

## 2014-03-18 NOTE — Telephone Encounter (Signed)
Rx refill sent to patient pharmacy   

## 2014-06-25 ENCOUNTER — Telehealth: Payer: Self-pay | Admitting: Cardiovascular Disease

## 2014-06-27 NOTE — Telephone Encounter (Signed)
Closed encounter °

## 2014-07-22 ENCOUNTER — Other Ambulatory Visit: Payer: Self-pay | Admitting: Cardiovascular Disease

## 2014-07-22 NOTE — Telephone Encounter (Signed)
Rx was sent to pharmacy electronically. 

## 2014-09-04 ENCOUNTER — Ambulatory Visit: Payer: Medicare Other | Admitting: Cardiovascular Disease

## 2014-10-17 ENCOUNTER — Ambulatory Visit (INDEPENDENT_AMBULATORY_CARE_PROVIDER_SITE_OTHER): Payer: PPO | Admitting: Cardiovascular Disease

## 2014-10-17 ENCOUNTER — Encounter: Payer: Self-pay | Admitting: Cardiovascular Disease

## 2014-10-17 VITALS — BP 170/100 | HR 56 | Ht 67.0 in | Wt 148.5 lb

## 2014-10-17 DIAGNOSIS — Z79899 Other long term (current) drug therapy: Secondary | ICD-10-CM

## 2014-10-17 DIAGNOSIS — E785 Hyperlipidemia, unspecified: Secondary | ICD-10-CM

## 2014-10-17 DIAGNOSIS — I4891 Unspecified atrial fibrillation: Secondary | ICD-10-CM

## 2014-10-17 DIAGNOSIS — I4510 Unspecified right bundle-branch block: Secondary | ICD-10-CM

## 2014-10-17 DIAGNOSIS — I480 Paroxysmal atrial fibrillation: Secondary | ICD-10-CM

## 2014-10-17 DIAGNOSIS — I10 Essential (primary) hypertension: Secondary | ICD-10-CM

## 2014-10-17 MED ORDER — AMLODIPINE BESYLATE 5 MG PO TABS: 5.0000 mg | ORAL_TABLET | Freq: Every day | ORAL | Status: DC

## 2014-10-17 NOTE — Patient Instructions (Addendum)
Your physician recommends that you return for lab work in: fasting.  Your physician has recommended you make the following change in your medication: start new prescription for amlodipine 5 mg. This has already been sent to your [facility] pharmacy.  Your physician has requested that you have an echocardiogram. Echocardiography is a painless test that uses sound waves to create images of your heart. It provides your doctor with information about the size and shape of your heart and how well your heart's chambers and valves are working. This procedure takes approximately one hour. There are no restrictions for this procedure.   Your physician recommends that you schedule a follow-up appointment in: 2 months.

## 2014-10-18 ENCOUNTER — Encounter: Payer: Self-pay | Admitting: Cardiovascular Disease

## 2014-10-18 DIAGNOSIS — I480 Paroxysmal atrial fibrillation: Secondary | ICD-10-CM | POA: Insufficient documentation

## 2014-10-18 HISTORY — DX: Paroxysmal atrial fibrillation: I48.0

## 2014-10-18 NOTE — Progress Notes (Signed)
Patient ID: Emily [name], female   DOB: [birth year], 74 y.o.   MRN: [id number]     HPI: Emily [name], is a 74 y.o. female who presents to the office to for a  One-yearcardiology followup evaluation.  Emily [name] has a history of PVCs as well as paroxysmal atrial fibrillation. She has documented right bundle branch block. I saw her for initial cardiology evaluation 10/23/2012. She has a history of significant hypertension, which in the past had been labile.  An echo Doppler study showed normal systolic function with grade 2 diastolic dysfunction. She did have mild MR, moderate TR, mild to moderate pulmonary hypertension, as well as mild aortic sclerosis with mild aortic insufficiency.  Her blood pressure medications have been titrated to now include atenolol 100 mg in the morning and 75 mg in the evening , lisinopril 40 mg daily , and she has available to take diltiazem 30 mg on a when necessary bass.  Remotely, prior to titration of her beta blocker.  She had to take the diltiazem much more frequently.  She now rarely has to take this and over the past year had only taken it on one occasion.   she is unaware of any breakthrough atrial fibrillation.  She denies significant leg swelling.  She denies chest pain. She does have a history of anxiety and takes lorazepam.  She denies significant weight gain.  She presents for evaluation.   Past Medical History  Diagnosis Date  . Palpitations   . Atrial fibrillation   . PVC's (premature ventricular contractions)   . Right bundle branch block   . HTN (hypertension)     11/20/12 -ECHO  EF 55-60%    Past Surgical History  Procedure Laterality Date  . None      Allergies  Allergen Reactions  . Penicillins     Current Outpatient Prescriptions  Medication Sig Dispense Refill  . aspirin 325 MG tablet Take 325 mg by mouth daily.    Marland Kitchen atenolol (TENORMIN) 50 MG tablet Takes 100 MG IN THE AM (2 tablets) AND 75 MG IN THE PM (1.5 tablets) 105  tablet 11  . diltiazem (CARDIZEM) 30 MG tablet Take 1 tablet (30 mg total) by mouth as needed. 30 tablet 6  . lisinopril (PRINIVIL,ZESTRIL) 40 MG tablet TAKE ONE TABLET BY MOUTH ONCE DAILY 30 tablet 0  . LORazepam (ATIVAN) 1 MG tablet Take 1 mg by mouth 3 (three) times daily.     Marland Kitchen amLODipine (NORVASC) 5 MG tablet Take 1 tablet (5 mg total) by mouth daily. 90 tablet 3   No current facility-administered medications for this visit.    Socially she is married has 3 children, 9 grandchildren and 2 great-grandchildren. She has been active over the summer months and typically progress in the backyard and tobacco use. She does drink occasional alcohol.  ROS General: Negative; No fevers, chills, or night sweats;  HEENT: Negative; No changes in vision or hearing, sinus congestion, difficulty swallowing Pulmonary: Negative; No cough, wheezing, shortness of breath, hemoptysis Cardiovascular: Negative; No chest pain, presyncope, syncope, palpitations GI: Negative; No nausea, vomiting, diarrhea, or abdominal pain GU: Negative; No dysuria, hematuria, or difficulty voiding Musculoskeletal: Negative; no myalgias, joint pain, or weakness Hematologic/Oncology: Negative; no easy bruising, bleeding Endocrine: Negative; no heat/cold intolerance; no diabetes Neuro: Negative; no changes in balance, headaches Skin: Negative; No rashes or skin lesions Psychiatric: Positive for occasional anxiety. Sleep: Negative; No snoring, daytime sleepiness, hypersomnolence, bruxism, restless legs, hypnogognic hallucinations, no cataplexy Other comprehensive  14 point system review is negative.  PE BP 170/100 mmHg  Pulse 56  Ht $R'5\' 7"'QJ$  (1.702 m)  Wt 148 lb 8 oz (67.359 kg)  BMI 23.25 kg/m2  Repeat blood pressure taken by me was 178/94 General: Alert, oriented, no distress.  Skin: normal turgor, no rashes HEENT: Normocephalic, atraumatic. Pupils round and reactive; sclera anicteric;no lid lag.  Nose without nasal septal  hypertrophy Mouth/Parynx benign; Mallinpatti scale 2 Neck: No JVD, no carotid bruits with normal upstroke Chest: No tenderness to palpation Lungs: clear to ausculatation and percussion; no wheezing or rales Heart: RRR, s1 s2 normal 1/6 SEM; .  No S3 gallop.  Soft S4.  No rubs thrills or heaves Abdomen: soft, nontender; no hepatosplenomehaly, BS+; abdominal aorta nontender and not dilated by palpation. Back: No CVA tenderness Pulses 2+ Extremities: no clubbing cyanosis or edema, Homan's sign negative  Neurologic: grossly nonfocal Psychological: Normal affect and mood   ECG (independently read by me):  Sinus bradycardia 56 bpm right bundle branch block with repolarization changes.  Normal intervals.  January 2015ECG (independently read by me): Sinus rhythm at 56 beats per minute. Right bundle branch block with repolarization changes. No ectopy. Normal intervals.  Review of prior ECG from 03/12/2013: Sinus rhythm at 55 beats per minute with right bundle branch block and repolarization changes. One isolated premature atrial contraction.  LABS:  BMET    Component Value Date/Time   NA 139 05/30/2009 1741   K 4.0 05/30/2009 1741   CL 107 05/30/2009 1741   CO2 26 05/30/2009 1741   GLUCOSE 142* 05/30/2009 1741   BUN 20 05/30/2009 1741   CREATININE 1.09 05/30/2009 1741   CALCIUM 8.5 05/30/2009 1741   GFRNONAA 50* 05/30/2009 1741   GFRAA  05/30/2009 1741    >60        The eGFR has been calculated using the MDRD equation. This calculation has not been validated in all clinical situations. eGFR's persistently <60 mL/min signify possible Chronic Kidney Disease.     Hepatic Function Panel  No results found for: PROT   CBC    Component Value Date/Time   WBC 12.1* 05/30/2009 1741   RBC 4.64 05/30/2009 1741   HGB 14.7 05/30/2009 1741   HCT 43.4 05/30/2009 1741   PLT 223 05/30/2009 1741   MCV 93.5 05/30/2009 1741   MCHC 33.8 05/30/2009 1741   RDW 12.5 05/30/2009 1741    LYMPHSABS 2.0 05/30/2009 1741   MONOABS 1.0 05/30/2009 1741   EOSABS 0.0 05/30/2009 1741   BASOSABS 0.0 05/30/2009 1741     BNP    Component Value Date/Time   PROBNP 128.0* 05/30/2009 1741    Lipid Panel  No results found for: CHOL   RADIOLOGY: No results found.    ASSESSMENT AND PLAN: Ms. Peraza is a 74 year old female who has a history of PVCs, RBBB as well as paroxysmal atrial fibrillation.  On her current medical regimen consisting of atenolol 100 in the morning and 75 mg at night in addition to lisinopril 40 mg, she is unaware of any significant episodes of breakthrough atrial fibrillation.  On only one occasion over the year that she take a 30 mg dose of diltiazem.  Her blood pressure today is elevated and she has stage II hypertension.  There are no signs of edema.  There are no signs of CHF.  I am scheduling her to have a 2-D echo Doppler study to reassess systolic and diastolic function.  I'm also recommending a urinalysis as well  as a complete set of laboratory I'm adding amlodipine 5 mg which should be helpful for blood pressure control.  She presently is bradycardic at 55 and for this reason, will not start her on long-acting diltiazem. She can still take 30 mg Cardizem if necessary for recurrent arrhythmia. I will see her back in the office in approximate 6-8 weeks and further recommendations will be made at that time.  Time spent: 30 minutes  Troy Sine, MD, [facility]  10/18/2014 12:53 PM

## 2014-10-24 ENCOUNTER — Ambulatory Visit (HOSPITAL_COMMUNITY): Payer: PPO

## 2014-12-15 ENCOUNTER — Telehealth (HOSPITAL_COMMUNITY): Payer: Self-pay | Admitting: *Deleted

## 2014-12-26 ENCOUNTER — Ambulatory Visit: Payer: PPO | Admitting: Cardiovascular Disease

## 2014-12-31 ENCOUNTER — Other Ambulatory Visit: Payer: Self-pay | Admitting: Cardiovascular Disease

## 2014-12-31 DIAGNOSIS — I4891 Unspecified atrial fibrillation: Secondary | ICD-10-CM

## 2015-01-01 ENCOUNTER — Ambulatory Visit (HOSPITAL_COMMUNITY)
Admission: RE | Admit: 2015-01-01 | Discharge: 2015-01-01 | Disposition: A | Discharge status: Home / Self Care | Payer: PPO | Source: Ambulatory Visit | Attending: Cardiology | Admitting: Cardiology

## 2015-01-01 DIAGNOSIS — I4891 Unspecified atrial fibrillation: Secondary | ICD-10-CM | POA: Insufficient documentation

## 2015-02-05 ENCOUNTER — Ambulatory Visit (INDEPENDENT_AMBULATORY_CARE_PROVIDER_SITE_OTHER): Payer: PPO | Admitting: Cardiovascular Disease

## 2015-02-05 VITALS — BP 152/80 | HR 60 | Ht 67.0 in | Wt 143.3 lb

## 2015-02-05 DIAGNOSIS — I480 Paroxysmal atrial fibrillation: Secondary | ICD-10-CM | POA: Diagnosis not present

## 2015-02-05 DIAGNOSIS — I272 Pulmonary hypertension, unspecified: Secondary | ICD-10-CM

## 2015-02-05 DIAGNOSIS — I4510 Unspecified right bundle-branch block: Secondary | ICD-10-CM

## 2015-02-05 DIAGNOSIS — I270 Primary pulmonary hypertension: Secondary | ICD-10-CM | POA: Diagnosis not present

## 2015-02-05 DIAGNOSIS — I10 Essential (primary) hypertension: Secondary | ICD-10-CM | POA: Diagnosis not present

## 2015-02-05 NOTE — Patient Instructions (Signed)
NO CHANGE IN MEDICATIONS  Your physician wants you to follow-up in 12 MONTHS DR KELLY.  You will receive a reminder letter in the mail two months in advance. If you don't receive a letter, please call our office to schedule the follow-up appointment.

## 2015-02-07 ENCOUNTER — Encounter: Payer: Self-pay | Admitting: Cardiovascular Disease

## 2015-02-07 DIAGNOSIS — I272 Pulmonary hypertension, unspecified: Secondary | ICD-10-CM | POA: Insufficient documentation

## 2015-02-07 HISTORY — DX: Pulmonary hypertension, unspecified: I27.20

## 2015-02-07 NOTE — Progress Notes (Signed)
Patient ID: Emily [name], female   DOB: 1941-04-05, 74 y.o.   MRN: [id number]     HPI: Emily [name], is a 74 y.o. female who presents to the office to for a 4 month cardiology followup evaluation.  Emily [name] has a history of PVCs as well as paroxysmal atrial fibrillation. She has documented right bundle branch block. I saw her for initial cardiology evaluation 10/23/2012. She has a history of significant hypertension, which in the past had been labile.  An echo Doppler study showed normal systolic function with grade 2 diastolic dysfunction. She did have mild MR, moderate TR, mild to moderate pulmonary hypertension, as well as mild aortic sclerosis with mild aortic insufficiency.  Her blood pressure medications have been titrated to now include atenolol 100 mg in the morning and 75 mg in the evening , lisinopril 40 mg daily , and she has available to take diltiazem 30 mg on a when necessary bass.  Remotely, prior to titration of her beta blocker she had to take the diltiazem much more frequently.  She now rarely has to take this and over the past year had only taken it on one occasion.  Since I last saw her, she underwent an echo Doppler study on 01/01/2015.  This showed an ejection fraction of 55-60%.  There was mild PA pressure increased at 34 mm.  There was mild aortic insufficiency.  Presently, Emily [name] feels well.  She remains active.  Yesterday she cut the grass for 6 hours.  She tells me she had laboratory done by Dr. Brent [name].  She was told that her labs looked good.  She denies breakthrough arrhythmia.  She denies shortness of breath.  She states her blood pressure at home has been stable.  She presents for evaluation.   Past Medical History  Diagnosis Date  . Palpitations   . Atrial fibrillation   . PVC's (premature ventricular contractions)   . Right bundle branch block   . HTN (hypertension)     11/20/12 -ECHO  EF 55-60%    Past Surgical History  Procedure  Laterality Date  . None      Allergies  Allergen Reactions  . Penicillins     Current Outpatient Prescriptions  Medication Sig Dispense Refill  . aspirin 325 MG tablet Take 325 mg by mouth daily.    Marland Kitchen atenolol (TENORMIN) 50 MG tablet Takes 100 MG IN THE AM (2 tablets) AND 75 MG IN THE PM (1.5 tablets) 105 tablet 11  . Coenzyme Q10 (CO Q 10) 100 MG CAPS Take 1 capsule by mouth daily.    Marland Kitchen diltiazem (CARDIZEM) 30 MG tablet Take 1 tablet (30 mg total) by mouth as needed. 30 tablet 6  . lisinopril (PRINIVIL,ZESTRIL) 40 MG tablet TAKE ONE TABLET BY MOUTH ONCE DAILY 30 tablet 0  . LORazepam (ATIVAN) 1 MG tablet Take 1 mg by mouth 3 (three) times daily.     Marland Kitchen OLIVE LEAF EXTRACT PO Take 1 capsule by mouth daily.     No current facility-administered medications for this visit.    Socially she is married has 3 children, 9 grandchildren and 2 great-grandchildren. She has been active over the summer months and typically progress in the backyard and tobacco use. She does drink occasional alcohol.  ROS General: Negative; No fevers, chills, or night sweats;  HEENT: Negative; No changes in vision or hearing, sinus congestion, difficulty swallowing Pulmonary: Negative; No cough, wheezing, shortness of breath, hemoptysis Cardiovascular: Negative; No chest pain,  presyncope, syncope, palpitations GI: Negative; No nausea, vomiting, diarrhea, or abdominal pain GU: Negative; No dysuria, hematuria, or difficulty voiding Musculoskeletal: Negative; no myalgias, joint pain, or weakness Hematologic/Oncology: Negative; no easy bruising, bleeding Endocrine: Negative; no heat/cold intolerance; no diabetes Neuro: Negative; no changes in balance, headaches Skin: Negative; No rashes or skin lesions Psychiatric: Positive for occasional anxiety. Sleep: Negative; No snoring, daytime sleepiness, hypersomnolence, bruxism, restless legs, hypnogognic hallucinations, no cataplexy Other comprehensive 14 point system  review is negative.  PE BP 152/80 mmHg  Pulse 60  Ht  (1.702 m)  Wt 65 kg (143 lb 4.8 oz)  BMI 22.44 kg/m2  Repeat blood pressure taken by me was 130/78  Wt Readings from Last 3 Encounters:  02/05/15 65 kg (143 lb 4.8 oz)  10/17/14 67.359 kg (148 lb 8 oz)  09/25/13 65.318 kg (144 lb)   General: Alert, oriented, no distress.  Skin: normal turgor, no rashes HEENT: Normocephalic, atraumatic. Pupils round and reactive; sclera anicteric;no lid lag.  Nose without nasal septal hypertrophy Mouth/Parynx benign; Mallinpatti scale 2 Neck: No JVD, no carotid bruits with normal upstroke Chest: No tenderness to palpation Lungs: clear to ausculatation and percussion; no wheezing or rales Heart: RRR, s1 s2 normal 1/6 SEM; .  No S3 gallop.  Soft S4.  No rubs thrills or heaves Abdomen: soft, nontender; no hepatosplenomehaly, BS+; abdominal aorta nontender and not dilated by palpation. Back: No CVA tenderness Pulses 2+ Extremities: no clubbing cyanosis or edema, Homan's sign negative  Neurologic: grossly nonfocal Psychological: Normal affect and mood  ECG (independently read by me): Normal sinus rhythm at 60 bpm.  Right bundle-branch block with repolarization changes.  No ectopy.  Normal intervals.   February 2016 ECG (independently read by me):  Sinus bradycardia 56 bpm right bundle branch block with repolarization changes.  Normal intervals.  January 2015 ECG (independently read by me): Sinus rhythm at 56 beats per minute. Right bundle branch block with repolarization changes. No ectopy. Normal intervals.  Review of prior ECG from 03/12/2013: Sinus rhythm at 55 beats per minute with right bundle branch block and repolarization changes. One isolated premature atrial contraction.  LABS: BMP Latest Ref Rng 05/30/2009  Glucose 70 - 99 mg/dL 161(W)  BUN 6 - 23 mg/dL 20  Creatinine 0.4 - 1.2 mg/dL 9.60  Sodium 454 - 098 mEq/L 139  Potassium 3.5 - 5.1 mEq/L 4.0  Chloride 96 - 112 mEq/L 107   CO2 19 - 32 mEq/L 26  Calcium 8.4 - 10.5 mg/dL 8.5   No flowsheet data found. CBC Latest Ref Rng 05/30/2009  WBC 4.0 - 10.5 K/uL 12.1(H)  Hemoglobin 12.0 - 15.0 g/dL 11.9  Hematocrit 14.7 - 46.0 % 43.4  Platelets 150 - 400 K/uL 223   Lab Results  Component Value Date   MCV 93.5 05/30/2009   No results found for: TSH  Lipid Panel  No results found for: CHOL, TRIG, HDL, CHOLHDL, VLDL, LDLCALC, LDLDIRECT  RADIOLOGY: No results found.  ------------------------------------------------------------------- ECHO Study Conclusions:  01/01/2015  - Left ventricle: The cavity size was normal. Systolic function was normal. The estimated ejection fraction was in the range of 55% to 60%. - Aortic valve: There was mild regurgitation. - Pulmonary arteries: PA peak pressure: 34 mm Hg (S).   ASSESSMENT AND PLAN: Emily. Baltazar is a 74 year old female who has a history of PVCs, RBBB as well as paroxysmal atrial fibrillation.  On her current medical regimen consisting of atenolol 100 in the morning and 75 mg at  night in addition to lisinopril 40 mg, she is unaware of any significant episodes of breakthrough atrial fibrillation.  I reviewed her recent echo Doppler study with her in detail.  She had normal systolic function.  Remotely, it was mentioned that she may have had stage II diastolic dysfunction but this was not demonstrated on the present study.  She has right bundle branch block which is chronic.  Her blood pressure today is stable when rechecked by me .  Her current regimen consisting of lisinopril 40 mg, atenolol 100 mg in the morning and 75 mg at night.  She continues to be active.  A complete set of blood work was drawn by her primary physician.  I will try to obtain these results.  She will continue her current medical regimen as prescribed.  I will see her in one year for reevaluation.  Time spent: 25 minutes  Lennette Bihari, MD, [facility]  02/07/2015 1:53 PM

## 2015-08-03 ENCOUNTER — Encounter (HOSPITAL_COMMUNITY): Payer: Self-pay | Admitting: *Deleted

## 2015-08-03 ENCOUNTER — Emergency Department (HOSPITAL_COMMUNITY): Payer: PPO

## 2015-08-03 ENCOUNTER — Emergency Department (HOSPITAL_COMMUNITY)
Admission: EM | Admit: 2015-08-03 | Discharge: 2015-08-03 | Disposition: A | Discharge status: Home / Self Care | Payer: PPO | Attending: Emergency Medicine | Admitting: Emergency Medicine

## 2015-08-03 ENCOUNTER — Other Ambulatory Visit: Payer: Self-pay

## 2015-08-03 DIAGNOSIS — R42 Dizziness and giddiness: Secondary | ICD-10-CM | POA: Insufficient documentation

## 2015-08-03 DIAGNOSIS — T50905A Adverse effect of unspecified drugs, medicaments and biological substances, initial encounter: Secondary | ICD-10-CM | POA: Diagnosis present

## 2015-08-03 DIAGNOSIS — R001 Bradycardia, unspecified: Secondary | ICD-10-CM

## 2015-08-03 DIAGNOSIS — I4891 Unspecified atrial fibrillation: Secondary | ICD-10-CM | POA: Diagnosis present

## 2015-08-03 DIAGNOSIS — I480 Paroxysmal atrial fibrillation: Secondary | ICD-10-CM | POA: Diagnosis present

## 2015-08-03 DIAGNOSIS — Z87891 Personal history of nicotine dependence: Secondary | ICD-10-CM | POA: Diagnosis not present

## 2015-08-03 DIAGNOSIS — Z79899 Other long term (current) drug therapy: Secondary | ICD-10-CM | POA: Insufficient documentation

## 2015-08-03 DIAGNOSIS — Z880 Allergy status to penicillin: Secondary | ICD-10-CM | POA: Diagnosis not present

## 2015-08-03 DIAGNOSIS — I10 Essential (primary) hypertension: Secondary | ICD-10-CM | POA: Diagnosis not present

## 2015-08-03 HISTORY — DX: Bradycardia, unspecified: R00.1

## 2015-08-03 LAB — CBC
HEMATOCRIT: 40.6 % (ref 36.0–46.0)
HEMOGLOBIN: 13.3 g/dL (ref 12.0–15.0)
MCH: 30.1 pg (ref 26.0–34.0)
MCHC: 32.8 g/dL (ref 30.0–36.0)
MCV: 91.9 fL (ref 78.0–100.0)
Platelets: 229 10*3/uL (ref 150–400)
RBC: 4.42 MIL/uL (ref 3.87–5.11)
RDW: 12.4 % (ref 11.5–15.5)
WBC: 10.4 10*3/uL (ref 4.0–10.5)

## 2015-08-03 LAB — BASIC METABOLIC PANEL
ANION GAP: 8 (ref 5–15)
BUN: 15 mg/dL (ref 6–20)
CHLORIDE: 105 mmol/L (ref 101–111)
CO2: 25 mmol/L (ref 22–32)
Calcium: 8.9 mg/dL (ref 8.9–10.3)
Creatinine, Ser: 0.95 mg/dL (ref 0.44–1.00)
GFR calc non Af Amer: 57 mL/min — ABNORMAL LOW (ref >60)
Glucose, Bld: 152 mg/dL — ABNORMAL HIGH (ref 65–99)
POTASSIUM: 4.2 mmol/L (ref 3.5–5.1)
SODIUM: 138 mmol/L (ref 135–145)

## 2015-08-03 LAB — TROPONIN I: Troponin I: <0.03 ng/mL (ref <0.031)

## 2015-08-03 MED ORDER — SODIUM CHLORIDE 0.9 % IV BOLUS (SEPSIS)
500.0000 mL | Freq: Once | INTRAVENOUS | Status: AC
  Administered 2015-08-03: 500 mL via INTRAVENOUS

## 2015-08-03 NOTE — Consult Note (Signed)
CARDIOLOGY CONSULT NOTE   Patient ID: Emily [name] MRN: [id number], DOB/AGE: Jan 24, 1941 74 y.o. Date of Encounter: 08/03/2015  Primary Physician: Emily Miyamoto, MD Primary Cardiologist: Dr Tresa Endo, Dr Graciela Husbands  Chief Complaint: afib, bradycardia  HPI: Emily [name] is a 74 y.o. female with a history of PVCs, PAF, RBBB, HTN, nl EF by echo 2014 with mild MR, moderate TR, mild to moderate pulmonary hypertension, mild aortic sclerosis with mild aortic insufficiency. Seen by Dr Tresa Endo last 09/2014 and was doing well. On Atenolol chronically with PRN Cardizem for palpitations. Pt CHADS2VASC=4 but she has refused any anticoag except ASA.   Pt was doing well when last seen by Dr Tresa Endo in June 2016. She rarely has palpitations. She had a a rapid HR that started about 10:30 pm. She took a total of 4 Cardizem before 1 am and her heart was slower, but still out of rhythm. She did not check her BP/HR. She went to bed.   She got up at about 5 am, and had significant orthostatic dizziness. She checked her BP/HR. SBP about 120, but HR was 140s. She took another Cardizem and the presyncope got worse, she would have episodes sitting still.  She called 911 and EMS showed her that her HR was very low. She was not hypotensive and did not require atropine. In the ER, she was given IVF 500 cc for SBP 90s and HR were in the 30s and 40s at first, but improved and she is not maintaining SR.   Pt refuses to stay in the hospital, stating her husband is disabled and she has to get home. She also reiterates her refusal of systemic anticoagulation.  Her last episode was in October, it resolved with 3 Cardizem tablets and she did not seek help.  Past Medical History  Diagnosis Date  . Palpitations   . Atrial fibrillation (HCC)   . PVC's (premature ventricular contractions)   . Right bundle branch block   . HTN (hypertension)     11/20/12 -ECHO  EF 55-60%    Surgical History:  Past Surgical History    Procedure Laterality Date  . None       I have reviewed the patient's current medications. Prior to Admission medications   Medication Sig Start Date End Date Taking? Authorizing Provider  aspirin 325 MG tablet Take 325 mg by mouth daily.   Yes Historical Provider, MD  atenolol (TENORMIN) 50 MG tablet Takes 100 MG IN THE AM (2 tablets) AND 75 MG IN THE PM (1.5 tablets) 04/19/13  Yes Lennette Bihari, MD  diltiazem (CARDIZEM) 30 MG tablet Take 1 tablet (30 mg total) by mouth as needed. 03/12/13  Yes Lennette Bihari, MD  lisinopril (PRINIVIL,ZESTRIL) 40 MG tablet TAKE ONE TABLET BY MOUTH ONCE DAILY 07/22/14  Yes Lennette Bihari, MD  LORazepam (ATIVAN) 1 MG tablet Take 1 mg by mouth 3 (three) times daily.    Yes Historical Provider, MD   Allergies:  Allergies  Allergen Reactions  . Penicillins     Other- faint    Social History   Social History  . Marital Status: Married    Spouse Name: N/A  . Number of Children: 3  . Years of Education: N/A   Occupational History  . [employer]    Social History Main Topics  . Smoking status: Former Smoker    Quit date: 09/01/1976  . Smokeless tobacco: Never Used  . Alcohol Use: 6.0 oz/week  10 Glasses of wine per week     Comment: 1-2 per night  . Drug Use: Yes     Comment: last used in 1975  . Sexual Activity: Not on file   Other Topics Concern  . Not on file   Social History Narrative   Cares for her husband who is disabled.    Family History  Problem Relation Age of Onset  . Aneurysm Mother   . Heart disease Father    Family Status  Relation Status Death Age  . Mother Deceased   . Father Deceased     Review of Systems:   Full 14-point review of systems otherwise negative except as noted above.  Physical Exam: Blood pressure 147/56, pulse 65, temperature 98.2 F (36.8 C), temperature source Oral, resp. rate 12, height 5\' 7"  (1.702 m), weight 142 lb (64.411 kg), SpO2 95 %. General: Well developed, well  nourished,female in no acute distress. Head: Normocephalic, atraumatic, sclera non-icteric, no xanthomas, nares are without discharge. Dentition: good Neck: No carotid bruits. JVD not elevated. No thyromegally Lungs: Good expansion bilaterally. without wheezes or rhonchi.  Heart: Regular rate and rhythm with S1 S2.  No S3 or S4.  No murmur, no rubs, or gallops appreciated. Abdomen: Soft, non-tender, non-distended with normoactive bowel sounds. No hepatomegaly. No rebound/guarding. No obvious abdominal masses. Msk:  Strength and tone appear normal for age. No joint deformities or effusions, no spine or costo-vertebral angle tenderness. Extremities: No clubbing or cyanosis. No edema.  Distal pedal pulses are 2+ in 4 extrem Neuro: Alert and oriented X 3. Moves all extremities spontaneously. No focal deficits noted. Psych:  Responds to questions appropriately with a normal affect. Skin: No rashes or lesions noted  Labs:   Lab Results  Component Value Date   WBC 10.4 08/03/2015   HGB 13.3 08/03/2015   HCT 40.6 08/03/2015   MCV 91.9 08/03/2015   PLT 229 08/03/2015      Recent Labs Lab 08/03/15 0928  NA 138  K 4.2  CL 105  CO2 25  BUN 15  CREATININE 0.95  CALCIUM 8.9  GLUCOSE 152*    Recent Labs  08/03/15 0928  TROPONINI <0.03   Radiology/Studies: Dg Chest 2 View 08/03/2015  CLINICAL DATA:  Bradycardia for 1 day EXAM: CHEST  2 VIEW COMPARISON:  May 30, 2009 FINDINGS: The heart size and mediastinal contours are within normal limits. There is no focal infiltrate, pulmonary edema, or pleural effusion. Minimal scar is identified in the lateral left lung base. The visualized skeletal structures are unremarkable. IMPRESSION: No active cardiopulmonary disease. Electronically Signed   By: Sherian ReinWei-Chen  Lin M.D.   On: 08/03/2015 12:33    Echo: 01/01/2015 - Left ventricle: The cavity size was normal. Systolic function was normal. The estimated ejection fraction was in the range of  55% to 60%. - Aortic valve: There was mild regurgitation. - Pulmonary arteries: PA peak pressure: 34 mm Hg (S).  ECG: 08/03/2015 SR, S brady with PACs  ASSESSMENT AND PLAN:  Principal Problem:   Bradycardia, drug induced - she has not had her atenolol today, but has no history of slow HR with this - she took a total of 6 Cardizem 30 mg tablets (5 old and 1 new) in less than 12 hours in addition to her usual atenolol - she converted to SR, but had significant bradycardia after that. - She does not get palpitations very often, it has been 1-2 x year - MD advise on continuing this with  a limit on Cardizem vs return to Dr Graciela Husbands for additional antiarrhythmic options  Active Problems:   Paroxysmal atrial fibrillation (HCC) - see above - CHADS2VASC=4, pt is adamant about ASA only for anticoagulation.    HTN - continue home rx, BP is generally well-controlled   Signed, Leanna Battles 08/03/2015 2:55 PM Beeper [phone]  Patient seen, examined. Available data reviewed. Agree with findings, assessment, and plan as outlined by Theodore Demark, PA-C. The patient is independently interviewed and examined. Her daughter is at the bedside. Exam reveals an alert, oriented woman in NAD, lungs CTA, heart RRR without murmur, abdomen soft and NT, extremities without edema. EKG's reviewed and she had marked symptomatic bradycardia after taking 6 cardizem tablets within a 12 hour period. She is now stable with a heart rate in the 60's and feels well. She has had similar rare episodes of symptomatic atrial fibrillation in the past. From my perspective there are a few issues:  Bradycardia - recommended that she continue her beta-blocker without change, and limit cardizem to a single dose to be used as needed. If persistent tachyarrhythmia, she needs to seek evaluation rather than take additional cardizem  Anticoagulation - she has refused anticoagulation in past. Reviewed clinical trial data, lack  of efficacy of ASA, and fact that her CHADS-Vasc = 4 (female, age 79 = 2, and HTN). Asked her to review with Dr Tresa Endo when she sees him in follow-up and would consider DOAC drug. She agrees.   Dispo: home from ER. Will follow-up with Dr Tresa Endo next available.   Tonny Bollman, M.D. 08/03/2015 3:24 PM

## 2015-08-03 NOTE — ED Notes (Signed)
MD Little aware of decrease in BP and HR.

## 2015-08-03 NOTE — ED Notes (Signed)
Pt presents via [facility] from home c/o feeling lightheaded and having palpitations.  Hx: Afib, HTN.  Pt takes Cardizem PRN, took 4 last night and 2 this AM-EMS reports these were out of date. When EMS arrived pt was in AFIB RVR, pt took one cardizem that was not out of date with EMS.  Pt now bradycardic, HR 37-40s.  MD Little aware. Pads placed on pt.  BP-130/78 P-72 R-18. Pt a x 4, NAD.

## 2015-08-03 NOTE — ED Provider Notes (Signed)
CSN: [id number]     Arrival date & time 08/03/15  [id number] History   First MD Initiated Contact with Patient 08/03/15 [phone]     Chief Complaint  Patient presents with  . Atrial Fibrillation     (Consider location/radiation/quality/duration/timing/severity/associated sxs/prior Treatment) HPI Comments: 74 year old female with past medical history including paroxysmal atrial fibrillation, hypertension, RBBB who presents with palpitations and lightheadedness. The patient states that last night she began feeling lightheaded associated with heart palpitations/fluttering sensation. She has had this previously with atrial fibrillation and took a Cardizem which has been written to use prn. She did not have relief of her symptoms so she continued to take Cardizem up to 4 tablets last night. She went to bed and this morning woke up with the same sx. She checked her HR around 6am and it was 140; BP was normal. She took a total of 4 more cardizem tablets this morning. She was picked up by EMS, and during transport states that she noticed her symptoms had resolved and her heart was no longer racing. Currently she denies any symptoms. She denies any associated chest pain, shortness of breath, or recent illness. She has not taken any of her medications except for Cardizem this morning.  Patient is a 74 y.o. female presenting with atrial fibrillation. The history is provided by the patient.  Atrial Fibrillation    Past Medical History  Diagnosis Date  . Palpitations   . Atrial fibrillation (HCC)   . PVC's (premature ventricular contractions)   . Right bundle branch block   . HTN (hypertension)     11/20/12 -ECHO  EF 55-60%   Past Surgical History  Procedure Laterality Date  . None     Family History  Problem Relation Age of Onset  . Aneurysm Mother   . Heart disease Father    Social History  Substance Use Topics  . Smoking status: Former Smoker    Quit date: 09/01/1976  . Smokeless tobacco: Never  Used  . Alcohol Use: 6.0 oz/week    10 Glasses of wine per week     Comment: 1-2 per night   OB History    No data available     Review of Systems 10 Systems reviewed and are negative for acute change except as noted in the HPI.   Allergies  Penicillins  Home Medications   Prior to Admission medications   Medication Sig Start Date End Date Taking? Authorizing Provider  aspirin 325 MG tablet Take 325 mg by mouth daily.   Yes Historical Provider, MD  atenolol (TENORMIN) 50 MG tablet Takes 100 MG IN THE AM (2 tablets) AND 75 MG IN THE PM (1.5 tablets) 04/19/13  Yes Lennette Biharihomas A Kelly, MD  diltiazem (CARDIZEM) 30 MG tablet Take 1 tablet (30 mg total) by mouth as needed. 03/12/13  Yes Lennette Biharihomas A Kelly, MD  lisinopril (PRINIVIL,ZESTRIL) 40 MG tablet TAKE ONE TABLET BY MOUTH ONCE DAILY 07/22/14  Yes Lennette Biharihomas A Kelly, MD  LORazepam (ATIVAN) 1 MG tablet Take 1 mg by mouth 3 (three) times daily.    Yes Historical Provider, MD   BP 146/68 mmHg  Pulse 69  Temp(Src) 98.2 F (36.8 C) (Oral)  Resp 16  Ht 5\' 7"  (1.702 m)  Wt 142 lb (64.411 kg)  BMI 22.24 kg/m2  SpO2 95% Physical Exam  Constitutional: She is oriented to person, place, and time. She appears well-developed and well-nourished. No distress.  HENT:  Head: Normocephalic and atraumatic.  Moist mucous membranes  Eyes:  Conjunctivae are normal. Pupils are equal, round, and reactive to light.  Neck: Neck supple.  Cardiovascular: Regular rhythm and normal heart sounds.   No murmur heard. Bradycardic  Pulmonary/Chest: Effort normal and breath sounds normal.  Abdominal: Soft. Bowel sounds are normal. She exhibits no distension. There is no tenderness.  Musculoskeletal: She exhibits no edema.  Neurological: She is alert and oriented to person, place, and time.  Fluent speech  Skin: Skin is warm and dry.  Psychiatric: She has a normal mood and affect. Judgment normal.  Nursing note and vitals reviewed.   ED Course  Procedures (including  critical care time) Labs Review Labs Reviewed  BASIC METABOLIC PANEL - Abnormal; Notable for the following:    Glucose, Bld 152 (*)    GFR calc non Af Amer 57 (*)    All other components within normal limits  CBC  TROPONIN I    Imaging Review Dg Chest 2 View  08/03/2015  CLINICAL DATA:  Bradycardia for 1 day EXAM: CHEST  2 VIEW COMPARISON:  May 30, 2009 FINDINGS: The heart size and mediastinal contours are within normal limits. There is no focal infiltrate, pulmonary edema, or pleural effusion. Minimal scar is identified in the lateral left lung base. The visualized skeletal structures are unremarkable. IMPRESSION: No active cardiopulmonary disease. Electronically Signed   By: Sherian Rein M.D.   On: 08/03/2015 12:33   I have personally reviewed and evaluated these lab results as part of my medical decision-making.   EKG Interpretation   Date/Time:  Monday August 03 2015 08:52:53 EST Ventricular Rate:  48 PR Interval:  174 QRS Duration: 129 QT Interval:  454 QTC Calculation: 406 R Axis:   -3 Text Interpretation:  Bradycardia with irregular rate Right bundle branch  block Since last tracing rate slower Confirmed by LITTLE MD, RACHEL  [phone]) on 08/03/2015 8:58:08 AM     Medications  sodium chloride 0.9 % bolus 500 mL (0 mLs Intravenous Stopped 08/03/15 1146)     MDM   Final diagnoses:  Bradycardia  accidental medication overdose   Patient presents with palpitations and lightheadedness that began last night, for which she has taken several doses of Cardizem yesterday evening and this morning. She was brought in by EMS and noted to be bradycardic during transport. On arrival, heart rate was near 40, initial BP 134/58. Placed pacer pads and gave NS bolus as patient became hypotensive to 90s systolic. She was mentating appropriately and denying complaints on exam. EKG showed slower rhythm but otherwise unchanged from previous. Obtained above labs as well as chest  x-ray.   Labwork unremarkable. After several hours in the ED, the patient's heart rate improved to normal and blood pressure also improved. Consulted cardiology, who evaluated patient and discussed outpatient follow-up care with her primary cardiologist, Dr. Tresa Endo. Patient voiced understanding of follow-up plan and was asymptomatic and well-appearing at time of discharge. Return precautions reviewed. Patient discharged in satisfactory condition.  Laurence Spates, MD 08/03/15 [phone]

## 2015-08-03 NOTE — ED Notes (Signed)
Patient transported to X-ray 

## 2015-09-04 ENCOUNTER — Ambulatory Visit: Payer: PPO | Admitting: Cardiovascular Disease

## 2015-09-24 ENCOUNTER — Ambulatory Visit: Payer: PPO | Admitting: Cardiovascular Disease

## 2015-12-01 ENCOUNTER — Ambulatory Visit (INDEPENDENT_AMBULATORY_CARE_PROVIDER_SITE_OTHER): Payer: PPO | Admitting: Cardiovascular Disease

## 2015-12-01 VITALS — BP 162/80 | HR 64 | Ht 67.0 in | Wt 147.4 lb

## 2015-12-01 DIAGNOSIS — I4891 Unspecified atrial fibrillation: Secondary | ICD-10-CM

## 2015-12-01 DIAGNOSIS — I4510 Unspecified right bundle-branch block: Secondary | ICD-10-CM | POA: Diagnosis not present

## 2015-12-01 DIAGNOSIS — I493 Ventricular premature depolarization: Secondary | ICD-10-CM

## 2015-12-01 DIAGNOSIS — I10 Essential (primary) hypertension: Secondary | ICD-10-CM | POA: Diagnosis not present

## 2015-12-01 DIAGNOSIS — I480 Paroxysmal atrial fibrillation: Secondary | ICD-10-CM

## 2015-12-01 MED ORDER — DILTIAZEM HCL ER COATED BEADS 120 MG PO CP24: 120.0000 mg | ORAL_CAPSULE | Freq: Every day | ORAL | Status: DC

## 2015-12-01 MED ORDER — ATENOLOL 50 MG PO TABS: ORAL_TABLET | ORAL | Status: DC

## 2015-12-01 NOTE — Patient Instructions (Signed)
Your physician has recommended you make the following change in your medication:   1.) decrease your aspirin down to 81 mg.  2.) start new diltiazem 120 mg at night.  3.) the atenolol has been changed to 75 mg in the morning and 50 mg at night.  Your physician recommends that you schedule a follow-up appointment in: 2 months.

## 2015-12-03 ENCOUNTER — Encounter: Payer: Self-pay | Admitting: Cardiovascular Disease

## 2015-12-03 NOTE — Progress Notes (Signed)
Patient ID: Emily [name], female   DOB: 1941/05/17, 75 y.o.   MRN: [id number]     HPI: Emily [name], is a 75 y.o. female who presents to the office to for a 10 month cardiology followup evaluation.  Emily [name] has a history of PVCs as well as paroxysmal atrial fibrillation. She has documented right bundle branch block. I saw her for initial cardiology evaluation 10/23/2012. She has a history of significant hypertension, which in the past had been labile.  An echo Doppler study showed normal systolic function with grade 2 diastolic dysfunction. She did have mild MR, moderate TR, mild to moderate pulmonary hypertension, as well as mild aortic sclerosis with mild aortic insufficiency.  Her blood pressure medications have been titrated to now include atenolol 100 mg in the morning and 75 mg in the evening , lisinopril 40 mg daily , and she has available to take diltiazem 30 mg on a when necessary bass.  Remotely, prior to titration of her beta blocker she had to take the diltiazem much more frequently.  She now rarely has to take this and over the past year had only taken it on one occasion.  She underwent an echo Doppler study on 01/01/2015.  This showed an ejection fraction of 55-60%.  There was mild PA pressure increased at 34 mm.  There was mild aortic insufficiency.  Since I saw her, she developed episodes of recurrent palpitations on 08/03/2015.  She had a prescription for Cardizem 30 mg to take as needed.  She apparently took 4 Cardizem 30 mg tablets between 10:30 PM and 1 AM and her heart rate was slower but still rhythm.  At 5 AM she developed some orthostatic dizziness but noted her heart rate was still elevated and took additional Cardizem.  She also he presented to the emergency room where she was hypotensive and bradycardic with heart rates in the 30s and 40s.  She was seen in the ER for cardiology evaluation and she had converted back to sinus rhythm.  Anticoagulation was again  discussed, but she had refused this in the past and also has refused this.  Presently.  She has been maintained on atenolol 100 mg in the morning and 75 mg in the evening, lisinopril 40 mg and aspirin.  She presents for reevaluation.   Past Medical History  Diagnosis Date  . Palpitations   . Atrial fibrillation (HCC)   . PVC's (premature ventricular contractions)   . Right bundle branch block   . HTN (hypertension)     11/20/12 -ECHO  EF 55-60%    Past Surgical History  Procedure Laterality Date  . None      Allergies  Allergen Reactions  . Penicillins     Other- faint     Current Outpatient Prescriptions  Medication Sig Dispense Refill  . aspirin 325 MG tablet Take 81 mg by mouth daily.    Marland Kitchen atenolol (TENORMIN) 50 MG tablet Takes 1.5 tablets ( 75 mg) in the morning and 50 mg in the pm 75 tablet 11  . diltiazem (CARDIZEM) 30 MG tablet Take 1 tablet (30 mg total) by mouth as needed. 30 tablet 6  . lisinopril (PRINIVIL,ZESTRIL) 40 MG tablet TAKE ONE TABLET BY MOUTH ONCE DAILY 30 tablet 0  . LORazepam (ATIVAN) 1 MG tablet Take 1 mg by mouth 3 (three) times daily.     Marland Kitchen diltiazem (DILTIAZEM CD) 120 MG 24 hr capsule Take 1 capsule (120 mg total) by mouth at bedtime. 30  capsule 6   No current facility-administered medications for this visit.    Socially she is married has 3 children, 9 grandchildren and 2 great-grandchildren. She has been active over the summer months and typically progress in the backyard and tobacco use. She does drink occasional alcohol.  ROS General: Negative; No fevers, chills, or night sweats;  HEENT: Negative; No changes in vision or hearing, sinus congestion, difficulty swallowing Pulmonary: Negative; No cough, wheezing, shortness of breath, hemoptysis Cardiovascular: Negative; No chest pain, presyncope, syncope, palpitations GI: Negative; No nausea, vomiting, diarrhea, or abdominal pain GU: Negative; No dysuria, hematuria, or difficulty  voiding Musculoskeletal: Negative; no myalgias, joint pain, or weakness Hematologic/Oncology: Negative; no easy bruising, bleeding Endocrine: Negative; no heat/cold intolerance; no diabetes Neuro: Negative; no changes in balance, headaches Skin: Negative; No rashes or skin lesions Psychiatric: Positive for occasional anxiety. Sleep: Negative; No snoring, daytime sleepiness, hypersomnolence, bruxism, restless legs, hypnogognic hallucinations, no cataplexy Other comprehensive 14 point system review is negative.  PE BP 162/80 mmHg  Pulse 64  Ht  (1.702 m)  Wt 147 lb 6.4 oz (66.86 kg)  BMI 23.08 kg/m2  Repeat blood pressure taken by me was 160/82  Wt Readings from Last 3 Encounters:  12/01/15 147 lb 6.4 oz (66.86 kg)  08/03/15 142 lb (64.411 kg)  02/05/15 143 lb 4.8 oz (65 kg)   General: Alert, oriented, no distress.  Skin: normal turgor, no rashes HEENT: Normocephalic, atraumatic. Pupils round and reactive; sclera anicteric;no lid lag.  Nose without nasal septal hypertrophy Mouth/Parynx benign; Mallinpatti scale 2 Neck: No JVD, no carotid bruits with normal upstroke Chest: No tenderness to palpation Lungs: clear to ausculatation and percussion; no wheezing or rales Heart: RRR, s1 s2 normal 1/6 SEM; .  No S3 gallop.  Soft S4.  No rubs thrills or heaves Abdomen: soft, nontender; no hepatosplenomehaly, BS+; abdominal aorta nontender and not dilated by palpation. Back: No CVA tenderness Pulses 2+ Extremities: no clubbing cyanosis or edema, Homan's sign negative  Neurologic: grossly nonfocal Psychological: Normal affect and mood  ECG (independently read by me): Normal sinus rhythm at 64 bpm.  Right bundle-branch block with repolarization changes.  QTc interval 462 ms.  June 2016 ECG (independently read by me): Normal sinus rhythm at 60 bpm.  Right bundle-branch block with repolarization changes.  No ectopy.  Normal intervals.  February 2016 ECG (independently read by me):   Sinus bradycardia 56 bpm right bundle branch block with repolarization changes.  Normal intervals.  January 2015 ECG (independently read by me): Sinus rhythm at 56 beats per minute. Right bundle branch block with repolarization changes. No ectopy. Normal intervals.  Review of prior ECG from 03/12/2013: Sinus rhythm at 55 beats per minute with right bundle branch block and repolarization changes. One isolated premature atrial contraction.  LABS: BMP Latest Ref Rng 08/03/2015 05/30/2009  Glucose 65 - 99 mg/dL 161(W) 960(A)  BUN 6 - 20 mg/dL 15 20  Creatinine 5.40 - 1.00 mg/dL 9.81 1.91  Sodium 478 - 145 mmol/L 138 139  Potassium 3.5 - 5.1 mmol/L 4.2 4.0  Chloride 101 - 111 mmol/L 105 107  CO2 22 - 32 mmol/L 25 26  Calcium 8.9 - 10.3 mg/dL 8.9 8.5   No flowsheet data found. CBC Latest Ref Rng 08/03/2015 05/30/2009  WBC 4.0 - 10.5 K/uL 10.4 12.1(H)  Hemoglobin 12.0 - 15.0 g/dL 29.5 62.1  Hematocrit 30.8 - 46.0 % 40.6 43.4  Platelets 150 - 400 K/uL 229 223   Lab Results  Component  Value Date   MCV 91.9 08/03/2015   MCV 93.5 05/30/2009   No results found for: TSH  Lipid Panel  No results found for: CHOL, TRIG, HDL, CHOLHDL, VLDL, LDLCALC, LDLDIRECT  RADIOLOGY: No results found.  ------------------------------------------------------------------- ECHO Study Conclusions:  01/01/2015  - Left ventricle: The cavity size was normal. Systolic function was normal. The estimated ejection fraction was in the range of 55% to 60%. - Aortic valve: There was mild regurgitation. - Pulmonary arteries: PA peak pressure: 34 mm Hg (S).   ASSESSMENT AND PLAN: Emily [name] is a 75 year-old female who has a history of PVCs, hypertension, RBBB as well as paroxysmal atrial fibrillation.  She had been maintaining sinus rhythm and periodically has taken Cardizem 30 mg if she had noticed recurrent palpitations.  I reviewed her hospital records and cardiology consult evaluation from her December  evaluation.  At that time she had taken extra 6 tablets of Cardizem CD 30 mg, which ultimately led to development of orthostatic hypotension and bradycardia.  She has been reluctant to initiate anticoagulation therapy.  On exam today, her blood pressure is elevated.  She is in sinus rhythm with a heart rate at 64.  I long discussion with her and discussed different strategies to control both blood pressure and heart rate.  I'm electing to institute Cardizem CD 120 mg to take at bedtime but so she does not become significant only bradycardic.  She will reduce her atenolol dose from 100 mg in the morning and 75 mg at night to 75 mg in the morning and 50 mg at night.  She does not have any swelling on exam and is against consideration of diuretic regimen.  We again discussed possible anticoagulation, which she declines.  I have suggested she reduce her aspirin to 81 mg.  She will contact me if she develops any episodes of dizziness, orthostasis, or tachyarrhythmias.  I will see her in 2 months for reevaluation.  Time spent: 25 minutes  Lennette Biharihomas A. Shyloh Krinke, MD, [facility]  12/03/2015 12:08 PM

## 2016-02-17 ENCOUNTER — Encounter: Payer: Self-pay | Admitting: Cardiovascular Disease

## 2016-02-17 ENCOUNTER — Telehealth: Payer: Self-pay | Admitting: Cardiovascular Disease

## 2016-02-17 ENCOUNTER — Ambulatory Visit (INDEPENDENT_AMBULATORY_CARE_PROVIDER_SITE_OTHER): Payer: PPO | Admitting: Cardiovascular Disease

## 2016-02-17 VITALS — BP 180/82 | HR 59 | Ht 67.0 in | Wt 142.4 lb

## 2016-02-17 DIAGNOSIS — I10 Essential (primary) hypertension: Secondary | ICD-10-CM | POA: Diagnosis not present

## 2016-02-17 DIAGNOSIS — I272 Other secondary pulmonary hypertension: Secondary | ICD-10-CM | POA: Diagnosis not present

## 2016-02-17 DIAGNOSIS — I480 Paroxysmal atrial fibrillation: Secondary | ICD-10-CM

## 2016-02-17 DIAGNOSIS — Z79899 Other long term (current) drug therapy: Secondary | ICD-10-CM

## 2016-02-17 DIAGNOSIS — I4891 Unspecified atrial fibrillation: Secondary | ICD-10-CM

## 2016-02-17 DIAGNOSIS — I4510 Unspecified right bundle-branch block: Secondary | ICD-10-CM

## 2016-02-17 MED ORDER — AMLODIPINE BESYLATE 5 MG PO TABS: 5.0000 mg | ORAL_TABLET | Freq: Every day | ORAL | Status: DC

## 2016-02-17 MED ORDER — ASPIRIN EC 81 MG PO TBEC: 81.0000 mg | DELAYED_RELEASE_TABLET | Freq: Every day | ORAL | Status: DC

## 2016-02-17 NOTE — Patient Instructions (Signed)
Your physician has recommended you make the following change in your medication:   1.) decrease the aspirin down to 81 mg daily.  2.) start amlodipine  Prescription that has been sent to your pharmacy.  Your physician recommends that you return for lab work. You can have this done by your PCP. Lab orders have been provided for you to carry with you.  Your physician recommends that you schedule a follow-up appointment in: 3 months.

## 2016-02-17 NOTE — Telephone Encounter (Signed)
Pt seen today, she rechecked BP at home - 133/60 w HR 64  Advise if anything further required.

## 2016-02-17 NOTE — Telephone Encounter (Signed)
New Message  Pt called asking that MD/ RN be notified, once returning home she took bp medication and bp is now 133/60, pulse 64

## 2016-02-18 ENCOUNTER — Encounter: Payer: Self-pay | Admitting: Cardiovascular Disease

## 2016-02-18 NOTE — Progress Notes (Signed)
Patient ID: Emily CravenMary T [name], female   DOB: 11/14/1940, 75 y.o.   MRN: [id number]     HPI: Emily [name], is a 75 y.o. female who presents to the office to for a 10 month cardiology followup evaluation.  Ms. Emily RomneyMcMasters has a history of PVCs as well as paroxysmal atrial fibrillation. She has documented right bundle branch block. I saw her for initial cardiology evaluation 10/23/2012. She has a history of significant hypertension, which in the past had been labile.  An echo Doppler study showed normal systolic function with grade 2 diastolic dysfunction. She did have mild MR, moderate TR, mild to moderate pulmonary hypertension, as well as mild aortic sclerosis with mild aortic insufficiency.  Her blood pressure medications have been titrated to now include atenolol 100 mg in the morning and 75 mg in the evening , lisinopril 40 mg daily , and she has available to take diltiazem 30 mg on a when necessary bass.  Remotely, prior to titration of her beta blocker she had to take the diltiazem much more frequently.  She now rarely has to take this and over the past year had only taken it on one occasion.  She underwent an echo Doppler study on 01/01/2015.  This showed an ejection fraction of 55-60%.  There was mild PA pressure increased at 34 mm.  There was mild aortic insufficiency.  She developed episodes of recurrent palpitations on 08/03/2015.  She had a prescription for Cardizem 30 mg to take as needed.  She apparently took 4 Cardizem 30 mg tablets between 10:30 PM and 1 AM and her heart rate was slower but still rhythm.  At 5 AM she developed some orthostatic dizziness but noted her heart rate was still elevated and took additional Cardizem.  She also he presented to the emergency room where she was hypotensive and bradycardic with heart rates in the 30s and 40s.  She was seen in the ER for cardiology evaluation and she had converted back to sinus rhythm.  Anticoagulation was again discussed, but she had  refused this in the past and also has refused this.  Presently.  She had been maintained on atenolol 100 mg in the morning and 75 mg in the evening, lisinopril 40 mg and aspirin.    When I last saw her, he also was hypertensive with a blood pressure of 162/80.  I suggested that she institute Cardizem CD 120 mg at bedtime and to reduce potnetial  bradycardia to reduce her atenolol from 100 mg in the morning to 75 mg and from 75 mg at night to 50 mg.  Apparently, she never did this.  She thought thought that by doing TyChi for 3 months her blood pressure would do well.  She would not require additional treatment.  She also never reduced her aspirin to 81 mg.  She presents for follow up evaluation.   Past Medical History  Diagnosis Date  . Palpitations   . Atrial fibrillation (HCC)   . PVC's (premature ventricular contractions)   . Right bundle branch block   . HTN (hypertension)     11/20/12 -ECHO  EF 55-60%    Past Surgical History  Procedure Laterality Date  . None      Allergies  Allergen Reactions  . Penicillins     Other- faint     Current Outpatient Prescriptions  Medication Sig Dispense Refill  . atenolol (TENORMIN) 50 MG tablet Takes 1.5 tablets ( 75 mg) in the morning and 50 mg in  the pm 75 tablet 11  . diltiazem (CARDIZEM) 30 MG tablet Take 1 tablet (30 mg total) by mouth as needed. 30 tablet 6  . lisinopril (PRINIVIL,ZESTRIL) 40 MG tablet TAKE ONE TABLET BY MOUTH ONCE DAILY 30 tablet 0  . LORazepam (ATIVAN) 1 MG tablet Take 1 mg by mouth 3 (three) times daily.     Marland Kitchen. amLODipine (NORVASC) 5 MG tablet Take 1 tablet (5 mg total) by mouth at bedtime. 30 tablet 6  . aspirin EC 81 MG tablet Take 1 tablet (81 mg total) by mouth daily. 90 tablet 3   No current facility-administered medications for this visit.    Socially she is married has 3 children, 9 grandchildren and 2 great-grandchildren. She has been active over the summer months and typically progress in the backyard and  tobacco use. She does drink occasional alcohol.  ROS General: Negative; No fevers, chills, or night sweats;  HEENT: Negative; No changes in vision or hearing, sinus congestion, difficulty swallowing Pulmonary: Negative; No cough, wheezing, shortness of breath, hemoptysis Cardiovascular: Negative; No chest pain, presyncope, syncope, palpitations GI: Negative; No nausea, vomiting, diarrhea, or abdominal pain GU: Negative; No dysuria, hematuria, or difficulty voiding Musculoskeletal: Negative; no myalgias, joint pain, or weakness Hematologic/Oncology: Negative; no easy bruising, bleeding Endocrine: Negative; no heat/cold intolerance; no diabetes Neuro: Negative; no changes in balance, headaches Skin: Negative; No rashes or skin lesions Psychiatric: Positive for occasional anxiety. Sleep: Negative; No snoring, daytime sleepiness, hypersomnolence, bruxism, restless legs, hypnogognic hallucinations, no cataplexy Other comprehensive 14 point system review is negative.  PE BP 180/82 mmHg  Pulse 59  Ht 5\' 7"  (1.702 m)  Wt 142 lb 6.4 oz (64.592 kg)  BMI 22.30 kg/m2  Repeat blood pressure taken by me was 160/82  Wt Readings from Last 3 Encounters:  02/17/16 142 lb 6.4 oz (64.592 kg)  12/01/15 147 lb 6.4 oz (66.86 kg)  08/03/15 142 lb (64.411 kg)   General: Alert, oriented, no distress.  Skin: normal turgor, no rashes HEENT: Normocephalic, atraumatic. Pupils round and reactive; sclera anicteric;no lid lag.  Nose without nasal septal hypertrophy Mouth/Parynx benign; Mallinpatti scale 2 Neck: No JVD, no carotid bruits with normal upstroke Chest: No tenderness to palpation Lungs: clear to ausculatation and percussion; no wheezing or rales Heart: RRR, s1 s2 normal 1/6 SEM; .  No S3 gallop.  Soft S4.  No rubs thrills or heaves Abdomen: soft, nontender; no hepatosplenomehaly, BS+; abdominal aorta nontender and not dilated by palpation. Back: No CVA tenderness Pulses 2+ Extremities: no  clubbing cyanosis or edema, Homan's sign negative  Neurologic: grossly nonfocal Psychological: Normal affect and mood  ECG (independently read by me): Sinus bradycardia 59 bpm.  No ectopy.  Right bundle branch block with repolarization changes  April 2017 ECG (independently read by me): Normal sinus rhythm at 64 bpm.  Right bundle-branch block with repolarization changes.  QTc interval 462 ms.  June 2016 ECG (independently read by me): Normal sinus rhythm at 60 bpm.  Right bundle-branch block with repolarization changes.  No ectopy.  Normal intervals.  February 2016 ECG (independently read by me):  Sinus bradycardia 56 bpm right bundle branch block with repolarization changes.  Normal intervals.  January 2015 ECG (independently read by me): Sinus rhythm at 56 beats per minute. Right bundle branch block with repolarization changes. No ectopy. Normal intervals.  Review of prior ECG from 03/12/2013: Sinus rhythm at 55 beats per minute with right bundle branch block and repolarization changes. One isolated premature atrial contraction.  LABS: BMP Latest Ref Rng 08/03/2015 05/30/2009  Glucose 65 - 99 mg/dL 191(Y) 782(N)  BUN 6 - 20 mg/dL 15 20  Creatinine 5.62 - 1.00 mg/dL 1.30 8.65  Sodium 784 - 145 mmol/L 138 139  Potassium 3.5 - 5.1 mmol/L 4.2 4.0  Chloride 101 - 111 mmol/L 105 107  CO2 22 - 32 mmol/L 25 26  Calcium 8.9 - 10.3 mg/dL 8.9 8.5   No flowsheet data found. CBC Latest Ref Rng 08/03/2015 05/30/2009  WBC 4.0 - 10.5 K/uL 10.4 12.1(H)  Hemoglobin 12.0 - 15.0 g/dL 69.6 29.5  Hematocrit 28.4 - 46.0 % 40.6 43.4  Platelets 150 - 400 K/uL 229 223   Lab Results  Component Value Date   MCV 91.9 08/03/2015   MCV 93.5 05/30/2009   No results found for: TSH  Lipid Panel  No results found for: CHOL, TRIG, HDL, CHOLHDL, VLDL, LDLCALC, LDLDIRECT  RADIOLOGY: No results found.  ------------------------------------------------------------------- ECHO Study Conclusions:  01/01/2015  -  Left ventricle: The cavity size was normal. Systolic function was normal. The estimated ejection fraction was in the range of 55% to 60%. - Aortic valve: There was mild regurgitation. - Pulmonary arteries: PA peak pressure: 34 mm Hg (S).   ASSESSMENT AND PLAN: Ms. Malta is a 75 year-old female who has a history of PVCs, hypertension, RBBB as well as paroxysmal atrial fibrillation.  She had been maintaining sinus rhythm and periodically has taken Cardizem 30 mg if she had noticed recurrent palpitations.  In December 2016  she had taken extra 6 tablets of Cardizem CD 30 mg, which ultimately led to development of orthostatic hypotension and bradycardia.  She has been reluctant to initiate anticoagulation therapy.  When I last saw her, she was hypertensive and recommended the addition of Cardizem CD 120 mg nightly and I reduced her atenolol dose.  However, none of this was done by the patient.  She continues to take atenolol 100 mg in the morning and 75 mg at night and never started Cardizem 120 daily.  Her EKG today reveals sinus bradycardia 59.  Presently, her blood pressure continues to be elevated and was 180/82.  She will continue her current dose of atenolol and I am electing to now add amlodipine 5 mg at bedtime.  I again recommended she reduce her aspirin 81 mg.  Fasting blood work will be obtained.  I will see her back in the office in 2-3 months for reevaluation.  Time spent: 25 minutes  Lennette Bihari, MD, [facility]  02/18/2016 6:23 PM

## 2016-02-21 NOTE — Telephone Encounter (Signed)
BP and HR good

## 2016-04-01 ENCOUNTER — Encounter (HOSPITAL_COMMUNITY): Payer: Self-pay | Admitting: Emergency Medicine

## 2016-04-01 ENCOUNTER — Emergency Department (HOSPITAL_COMMUNITY)
Admission: EM | Admit: 2016-04-01 | Discharge: 2016-04-01 | Disposition: A | Discharge status: Home / Self Care | Payer: PPO | Attending: Emergency Medicine | Admitting: Emergency Medicine

## 2016-04-01 ENCOUNTER — Emergency Department (HOSPITAL_COMMUNITY): Payer: PPO

## 2016-04-01 DIAGNOSIS — S01511A Laceration without foreign body of lip, initial encounter: Secondary | ICD-10-CM | POA: Insufficient documentation

## 2016-04-01 DIAGNOSIS — Y999 Unspecified external cause status: Secondary | ICD-10-CM | POA: Diagnosis not present

## 2016-04-01 DIAGNOSIS — R001 Bradycardia, unspecified: Secondary | ICD-10-CM

## 2016-04-01 DIAGNOSIS — S00501A Unspecified superficial injury of lip, initial encounter: Secondary | ICD-10-CM | POA: Diagnosis present

## 2016-04-01 DIAGNOSIS — Y929 Unspecified place or not applicable: Secondary | ICD-10-CM | POA: Diagnosis not present

## 2016-04-01 DIAGNOSIS — W19XXXA Unspecified fall, initial encounter: Secondary | ICD-10-CM

## 2016-04-01 DIAGNOSIS — W1830XA Fall on same level, unspecified, initial encounter: Secondary | ICD-10-CM | POA: Diagnosis not present

## 2016-04-01 DIAGNOSIS — Y939 Activity, unspecified: Secondary | ICD-10-CM | POA: Insufficient documentation

## 2016-04-01 DIAGNOSIS — R55 Syncope and collapse: Secondary | ICD-10-CM | POA: Insufficient documentation

## 2016-04-01 DIAGNOSIS — Z87891 Personal history of nicotine dependence: Secondary | ICD-10-CM | POA: Diagnosis not present

## 2016-04-01 DIAGNOSIS — Z79899 Other long term (current) drug therapy: Secondary | ICD-10-CM | POA: Insufficient documentation

## 2016-04-01 DIAGNOSIS — Z7982 Long term (current) use of aspirin: Secondary | ICD-10-CM | POA: Insufficient documentation

## 2016-04-01 LAB — CBC WITH DIFFERENTIAL/PLATELET
BASOS ABS: 0 10*3/uL (ref 0.0–0.1)
BASOS PCT: 0 %
Eosinophils Absolute: 0.1 10*3/uL (ref 0.0–0.7)
Eosinophils Relative: 1 %
HEMATOCRIT: 41.3 % (ref 36.0–46.0)
HEMOGLOBIN: 13.7 g/dL (ref 12.0–15.0)
Lymphocytes Relative: 16 %
Lymphs Abs: 1.8 10*3/uL (ref 0.7–4.0)
MCH: 30.5 pg (ref 26.0–34.0)
MCHC: 33.2 g/dL (ref 30.0–36.0)
MCV: 92 fL (ref 78.0–100.0)
Monocytes Absolute: 0.9 10*3/uL (ref 0.1–1.0)
Monocytes Relative: 8 %
NEUTROS ABS: 7.9 10*3/uL — AB (ref 1.7–7.7)
NEUTROS PCT: 75 %
Platelets: 217 10*3/uL (ref 150–400)
RBC: 4.49 MIL/uL (ref 3.87–5.11)
RDW: 12.4 % (ref 11.5–15.5)
WBC: 10.6 10*3/uL — AB (ref 4.0–10.5)

## 2016-04-01 LAB — URINALYSIS W MICROSCOPIC (NOT AT ARMC)
Bacteria, UA: NONE SEEN
Bilirubin Urine: NEGATIVE
GLUCOSE, UA: NEGATIVE mg/dL
HGB URINE DIPSTICK: NEGATIVE
Ketones, ur: NEGATIVE mg/dL
Nitrite: NEGATIVE
PROTEIN: NEGATIVE mg/dL
Specific Gravity, Urine: 1.007 (ref 1.005–1.030)
pH: 6 (ref 5.0–8.0)

## 2016-04-01 LAB — I-STAT CHEM 8, ED
BUN: 20 mg/dL (ref 6–20)
CREATININE: 0.9 mg/dL (ref 0.44–1.00)
Calcium, Ion: 1.15 mmol/L (ref 1.12–1.23)
Chloride: 103 mmol/L (ref 101–111)
GLUCOSE: 135 mg/dL — AB (ref 65–99)
HEMATOCRIT: 40 % (ref 36.0–46.0)
HEMOGLOBIN: 13.6 g/dL (ref 12.0–15.0)
POTASSIUM: 4.2 mmol/L (ref 3.5–5.1)
Sodium: 141 mmol/L (ref 135–145)
TCO2: 26 mmol/L (ref 0–100)

## 2016-04-01 LAB — I-STAT TROPONIN, ED: Troponin i, poc: 0 ng/mL (ref 0.00–0.08)

## 2016-04-01 MED ORDER — SODIUM CHLORIDE 0.9 % IV BOLUS (SEPSIS)
1000.0000 mL | Freq: Once | INTRAVENOUS | Status: AC
  Administered 2016-04-01: 1000 mL via INTRAVENOUS

## 2016-04-01 NOTE — ED Notes (Signed)
Patient ambulates to the bathroom with stable and steady gait.

## 2016-04-01 NOTE — ED Notes (Signed)
Patient transported to CT 

## 2016-04-01 NOTE — ED Notes (Signed)
Pt departed in NAD, refused use of wheelchair.  

## 2016-04-01 NOTE — ED Provider Notes (Signed)
[facility]EM[facility]ENCY DEPT Provider Note   CSN: [id number] Arrival date & time: 04/01/16  1614  First Provider Contact:  None       History   Chief Complaint Chief Complaint  Patient presents with  . Loss of Consciousness    HPI Emily [name] is a 75 y.o. female.  HPI   75 year old female with history of A. fib, hypertension, palpitation is presenting today for evaluation of loss of consciousness.  Pt report mowing her lawn this AM for several hrs.  Near the end of mowing pt report having heart palpation, that felt similar to episodes of afib.  She check her Vital sign and noted that her HR was in the 130s.  She then took Cardizem 30mg  at 12:30PM.  It did not improve so at 1:15PM she took another 30mg  of Cardizem.  One additional one at 1:35PM, then one additional Cardizem at 2:15PM (total of 120mg ).  Just before 3pm, while walking she had a syncopal episode.  Pt came-to when her grandson was trying to arouse her while she was found on the ground.  Pt report lip pain from the fall.  Pain is mild.  Denies headache, neck pain, cp, sob, abd pain, back pain, or pain to her extremities. Denies any infectious sxs. She only ate a few crackers for breakfast.  EMS arrived and noted pt's HR to be in the 30s.  Pt currently at baseline.    Pt also admits she haven't been compliant with her BP medications due to concerns of side effect when she read their side effect profile.  She was taking atenolol twice in the morning and one at night.  Her doctor recently switched her to Metoprolol but after pt read the drug list side effect, she decided to continue taking her Atenolol, but cutting the dose in half to make it last longer.  She took one atenolol this AM along with her lisinopril.   Pt suppose to take atenolol.  Pt was suppose to take Cardizem 120mg  extended release nightly but have not been compliant as well.    Past Medical History:  Diagnosis Date  . Atrial fibrillation (HCC)   . HTN  (hypertension)    11/20/12 -ECHO  EF 55-60%  . Palpitations   . PVC's (premature ventricular contractions)   . Right bundle branch block     Patient Active Problem List   Diagnosis Date Noted  . Bradycardia, drug induced 08/03/2015  . Mild pulmonary hypertension (HCC) 02/07/2015  . Paroxysmal atrial fibrillation (HCC) 10/18/2014  . Atrial fibrillation   . HTN (hypertension)   . PVC's (premature ventricular contractions)   . Right bundle branch block     Past Surgical History:  Procedure Laterality Date  . none    . TONSILLECTOMY      OB History    No data available       Home Medications    Prior to Admission medications   Medication Sig Start Date End Date Taking? Authorizing Provider  amLODipine (NORVASC) 5 MG tablet Take 1 tablet (5 mg total) by mouth at bedtime. 02/17/16   Lennette Bihari, MD  aspirin EC 81 MG tablet Take 1 tablet (81 mg total) by mouth daily. 02/17/16   Lennette Bihari, MD  atenolol (TENORMIN) 50 MG tablet Takes 1.5 tablets ( 75 mg) in the morning and 50 mg in the pm 12/01/15   Lennette Bihari, MD  diltiazem (CARDIZEM) 30 MG tablet Take 1 tablet (30 mg total) by  mouth as needed. 03/12/13   Lennette Bihari, MD  lisinopril (PRINIVIL,ZESTRIL) 40 MG tablet TAKE ONE TABLET BY MOUTH ONCE DAILY 07/22/14   Lennette Bihari, MD  LORazepam (ATIVAN) 1 MG tablet Take 1 mg by mouth 3 (three) times daily.     Historical Provider, MD    Family History Family History  Problem Relation Age of Onset  . Aneurysm Mother   . Heart disease Father     Social History Social History  Substance Use Topics  . Smoking status: Former Smoker    Quit date: 09/01/1976  . Smokeless tobacco: Never Used  . Alcohol use 0.0 oz/week     Comment: occassional     Allergies   Penicillins   Review of Systems Review of Systems  All other systems reviewed and are negative.    Physical Exam Updated Vital Signs SpO2 100%   Physical Exam  Constitutional: She appears well-developed  and well-nourished. No distress.  Elderly Caucasian female laying in bed in no acute discomfort.  HENT:  Head: Atraumatic.  Small 77mm laceration noted to mid upper lip not crossing the vermilion border. No hemotympanum, no septal hematoma, no malocclusion, no midface tenderness, no dental intrusion or extrusion.  Eyes: Conjunctivae are normal.  Neck: Neck supple.  No cervical midline spine tenderness crepitus or step-off  Cardiovascular:  Bradycardia without murmurs rubs or gallops  Pulmonary/Chest: Effort normal and breath sounds normal.  Abdominal: Soft. Bowel sounds are normal. She exhibits no distension. There is no tenderness.  Musculoskeletal: Normal range of motion.  5/5 shows in all 4 extremities  Neurological: She is alert.  Neurologic exam:  Speech clear, pupils equal round reactive to light, extraocular movements intact  Normal peripheral visual fields Cranial nerves III through XII normal including no facial droop Follows commands, moves all extremities x4, normal strength to bilateral upper and lower extremities at all major muscle groups including grip Sensation normal to light touch  Coordination intact, no limb ataxia, finger-nose-finger normal Rapid alternating movements normal No pronator drift Gait normal   Skin: No rash noted.  Right knee: Small abrasion noted to the anterior knee. Knee with full range of motion with minimal tenderness.  Psychiatric: She has a normal mood and affect.  Nursing note and vitals reviewed.    ED Treatments / Results  Labs (all labs ordered are listed, but only abnormal results are displayed) Labs Reviewed  CBC WITH DIFFERENTIAL/PLATELET - Abnormal; Notable for the following:       Result Value   WBC 10.6 (*)    Neutro Abs 7.9 (*)    All other components within normal limits  I-STAT CHEM 8, ED - Abnormal; Notable for the following:    Glucose, Bld 135 (*)    All other components within normal limits  URINALYSIS W  MICROSCOPIC (NOT AT [facility])  I-STAT TROPOININ, ED  CBG MONITORING, ED    EKG  EKG Interpretation  Date/Time:  Friday April 01 2016 16:24:16 EDT Ventricular Rate:  41 PR Interval:    QRS Duration: 149 QT Interval:  458 QTC Calculation: 379 R Axis:   24 Text Interpretation:  Junctional rhythm IVCD, consider atypical RBBB since last tracing no significant change Confirmed by Effie Shy  MD, ELLIOTT [id number]) on 04/01/2016 5:46:19 PM       Radiology Ct Head Wo Contrast  Result Date: 04/01/2016 CLINICAL DATA:  Status post fall EXAM: CT HEAD WITHOUT CONTRAST CT CERVICAL SPINE WITHOUT CONTRAST TECHNIQUE: Multidetector CT imaging of the head and  cervical spine was performed following the standard protocol without intravenous contrast. Multiplanar CT image reconstructions of the cervical spine were also generated. COMPARISON:  None. FINDINGS: CT HEAD FINDINGS No acute cortical infarct, hemorrhage, or mass lesion ispresent. Ventricles are of normal size. No significant extra-axial fluid collection is present. The paranasal sinuses andmastoid air cells are clear. The osseous skull is intact. CT CERVICAL SPINE FINDINGS The alignment of the cervical spine is normal. The vertebral body heights are well preserved. The facet joints appear well-aligned. IMPRESSION: 1. No acute intracranial abnormality. 2. No evidence for cervical spine fracture. Electronically Signed   By: Signa Kell M.D.   On: 04/01/2016 18:11   Ct Cervical Spine Wo Contrast  Result Date: 04/01/2016 CLINICAL DATA:  Status post fall EXAM: CT HEAD WITHOUT CONTRAST CT CERVICAL SPINE WITHOUT CONTRAST TECHNIQUE: Multidetector CT imaging of the head and cervical spine was performed following the standard protocol without intravenous contrast. Multiplanar CT image reconstructions of the cervical spine were also generated. COMPARISON:  None. FINDINGS: CT HEAD FINDINGS No acute cortical infarct, hemorrhage, or mass lesion ispresent. Ventricles are of  normal size. No significant extra-axial fluid collection is present. The paranasal sinuses andmastoid air cells are clear. The osseous skull is intact. CT CERVICAL SPINE FINDINGS The alignment of the cervical spine is normal. The vertebral body heights are well preserved. The facet joints appear well-aligned. IMPRESSION: 1. No acute intracranial abnormality. 2. No evidence for cervical spine fracture. Electronically Signed   By: Signa Kell M.D.   On: 04/01/2016 18:11    Procedures Procedures (including critical care time)  Medications Ordered in ED Medications - No data to display   Initial Impression / Assessment and Plan / ED Course  I have reviewed the triage vital signs and the nursing notes.  Pertinent labs & imaging results that were available during my care of the patient were reviewed by me and considered in my medical decision making (see chart for details).  Clinical Course    BP 154/63   Pulse (!) 57   Resp 14   SpO2 97%    Final Clinical Impressions(s) / ED Diagnoses   Final diagnoses:  Fall  Syncope, unspecified syncope type  Bradycardia  Lip laceration, initial encounter    New Prescriptions New Prescriptions   No medications on file   5:36 PM Patient who is noncompliant with her blood pressure medication here for a syncopal episode. She experienced tachycardia after mowing the yard. Her tachycardia is likely due to increased exertion as well as been noncompliant with her beta blocker medication. She then took a series of Cardizem total 120 mg and subsequently had a syncopal episode, falling forward injuring her lips and her right knee. Small laceration noted to upper lip in which I offer suture repair patient declined. She has a small abrasion to her right knee with normal knee range of motion and unlikely to be fractures or dislocation. Given her age, will obtain head and neck CT scan check labs, EKG, give IV fluid, and will monitor closely.  7:13 PM Labs  are reassuring.  HR is around 55-60.  Pt sts that's her baseline.  She is back to her baseline. She request to be discharged.  Her head/neck CT scan is without acute finding.  UA result have not populatedd yet.  EKG without concerning arrhythmia.  No evidence of anemia.  Normal orthostatic vital sign.  Will discharge.  Encourage pt to f/u closely with cardiologist to discuss her medication to avoid  recurrent syncope. Return precaution discussed.  Care discussed with Dr. Laurence Aly, PA-C 04/01/16 1919    Mancel Bale, MD 04/02/16 [phone]

## 2016-04-01 NOTE — Discharge Instructions (Signed)
Please follow up closely with your cardiologist to discuss your medications usage to reduce risk of passing out.  Return if you have any concerns.

## 2016-04-01 NOTE — ED Triage Notes (Signed)
EMS - Patient coming from home with LOC with grandson present.  Patient took 120mg  Cardizem due to rapid pulse rate, medication is ordered PRN.  Patient is alert and oriented.

## 2016-04-04 ENCOUNTER — Telehealth: Payer: Self-pay | Admitting: Cardiovascular Disease

## 2016-04-04 NOTE — Telephone Encounter (Signed)
New message      Pt c/o Syncope: STAT if syncope occurred within 30 minutes and pt complains of lightheadedness High Priority if episode of passing out, completely, today or in last 24 hours   Did you pass out today? no 1. When is the last time you passed out?  Last friday  2. Has this occurred multiple times?   3. Did you have any symptoms prior to passing out? No. Pt states she took about 4 diltiazem prior to passing out.  Her HR was high and she took 4 pills every 45 minutes.  Then her HR went down in the 30's when the EMS arrived.  Pt thinks she took too many diltiazem pills.  Please advise

## 2016-04-04 NOTE — Telephone Encounter (Signed)
Follow up     Forgot to put pt's callback number in previous message

## 2016-04-04 NOTE — Telephone Encounter (Signed)
Returned call to patient. She stated she had rapid heartbeat last Friday. She took her diltiazem 30mg  4 times over about a 2 hour period. After doing so, she passed out. Her grandson witness this and called EMS.  She was evaluated in the ED. Patient is calling to let us know. She was prescribed metoprolol in the meantime because atenolol is on back order at her pharmacy. Patient has not taken the metoprolol yet and asked if she should. I advised patient to take the metoprolol as prescribed until the atenolol is back in stock. I also advised patient to call us or call 911 if she takes one diltiazem in the future for palpitations and it doesn't work. This is to prevent her from having the bradycardic episodes and syncope. She realized the danger in taking too many of the pills and verbalized understanding. She stated she was due for f/u appt with Dr Tresa EndoKelly in September. Appt schedule for patient at first available in October with Dr Tresa EndoKelly.

## 2016-04-14 ENCOUNTER — Telehealth: Payer: Self-pay | Admitting: Cardiovascular Disease

## 2016-04-14 ENCOUNTER — Emergency Department (HOSPITAL_COMMUNITY)
Admission: EM | Admit: 2016-04-14 | Discharge: 2016-04-14 | Discharge status: Against Medical Advice | Payer: PPO | Attending: Emergency Medicine | Admitting: Emergency Medicine

## 2016-04-14 ENCOUNTER — Encounter (HOSPITAL_COMMUNITY): Payer: Self-pay

## 2016-04-14 ENCOUNTER — Emergency Department (HOSPITAL_COMMUNITY): Payer: PPO

## 2016-04-14 DIAGNOSIS — R55 Syncope and collapse: Secondary | ICD-10-CM | POA: Diagnosis present

## 2016-04-14 DIAGNOSIS — Z7982 Long term (current) use of aspirin: Secondary | ICD-10-CM | POA: Diagnosis not present

## 2016-04-14 DIAGNOSIS — I4891 Unspecified atrial fibrillation: Secondary | ICD-10-CM

## 2016-04-14 DIAGNOSIS — Z87891 Personal history of nicotine dependence: Secondary | ICD-10-CM | POA: Diagnosis not present

## 2016-04-14 DIAGNOSIS — I469 Cardiac arrest, cause unspecified: Secondary | ICD-10-CM | POA: Diagnosis not present

## 2016-04-14 DIAGNOSIS — I10 Essential (primary) hypertension: Secondary | ICD-10-CM | POA: Insufficient documentation

## 2016-04-14 LAB — BASIC METABOLIC PANEL
Anion gap: 6 (ref 5–15)
BUN: 18 mg/dL (ref 6–20)
CALCIUM: 9.3 mg/dL (ref 8.9–10.3)
CO2: 24 mmol/L (ref 22–32)
CREATININE: 0.98 mg/dL (ref 0.44–1.00)
Chloride: 111 mmol/L (ref 101–111)
GFR calc Af Amer: >60 mL/min (ref >60)
GFR, EST NON AFRICAN AMERICAN: 55 mL/min — AB (ref >60)
GLUCOSE: 149 mg/dL — AB (ref 65–99)
POTASSIUM: 3.5 mmol/L (ref 3.5–5.1)
SODIUM: 141 mmol/L (ref 135–145)

## 2016-04-14 LAB — CBC WITH DIFFERENTIAL/PLATELET
Basophils Absolute: 0 10*3/uL (ref 0.0–0.1)
Basophils Relative: 0 %
EOS ABS: 0.1 10*3/uL (ref 0.0–0.7)
EOS PCT: 1 %
HCT: 40.7 % (ref 36.0–46.0)
Hemoglobin: 13.3 g/dL (ref 12.0–15.0)
LYMPHS ABS: 1.8 10*3/uL (ref 0.7–4.0)
LYMPHS PCT: 22 %
MCH: 30.6 pg (ref 26.0–34.0)
MCHC: 32.7 g/dL (ref 30.0–36.0)
MCV: 93.8 fL (ref 78.0–100.0)
MONO ABS: 0.9 10*3/uL (ref 0.1–1.0)
MONOS PCT: 11 %
Neutro Abs: 5.4 10*3/uL (ref 1.7–7.7)
Neutrophils Relative %: 66 %
PLATELETS: 190 10*3/uL (ref 150–400)
RBC: 4.34 MIL/uL (ref 3.87–5.11)
RDW: 12.4 % (ref 11.5–15.5)
WBC: 8.2 10*3/uL (ref 4.0–10.5)

## 2016-04-14 LAB — TSH: TSH: 2.084 u[IU]/mL (ref 0.350–4.500)

## 2016-04-14 LAB — I-STAT TROPONIN, ED: TROPONIN I, POC: 0.01 ng/mL (ref 0.00–0.08)

## 2016-04-14 LAB — MAGNESIUM: MAGNESIUM: 2 mg/dL (ref 1.7–2.4)

## 2016-04-14 MED ORDER — SODIUM CHLORIDE 0.9 % IV BOLUS (SEPSIS)
1000.0000 mL | Freq: Once | INTRAVENOUS | Status: AC
  Administered 2016-04-14: 1000 mL via INTRAVENOUS

## 2016-04-14 MED ORDER — DILTIAZEM LOAD VIA INFUSION
20.0000 mg | Freq: Once | INTRAVENOUS | Status: DC
  Filled 2016-04-14: qty 20

## 2016-04-14 MED ORDER — DILTIAZEM HCL-DEXTROSE 100-5 MG/100ML-% IV SOLN (PREMIX)
5.0000 mg/h | INTRAVENOUS | Status: DC
Start: 2016-04-14 — End: 2016-04-15

## 2016-04-14 NOTE — ED Provider Notes (Signed)
[facility]PT Provider Note   CSN: [id number] Arrival date & time: 04/14/16  1835     History   Chief Complaint Chief Complaint  Patient presents with  . Near Syncope    HPI Emily [name] is a 75 y.o. female.  75 yo F with a cc of palpitations and near syncope.  The patient has had multiple episodes of this. She sees cardiology for her A. fib with RVR. She has diltiazem which she continues with these events. She is currently on metoprolol. The started earlier this evening. She multiple episodes where she felt she is to pass out. A little bit of episodic chest pain and diaphoresis. Nothing seems to make this better or worse. She tried one diltiazem tablet without improvement.   The history is provided by the patient.  Near Syncope  This is a new problem. The current episode started less than 1 hour ago. The problem occurs constantly. The problem has not changed since onset.Pertinent negatives include no chest pain, no headaches and no shortness of breath. Nothing aggravates the symptoms. Nothing relieves the symptoms. She has tried nothing for the symptoms. The treatment provided no relief.    Past Medical History:  Diagnosis Date  . Atrial fibrillation (HCC)   . HTN (hypertension)    11/20/12 -ECHO  EF 55-60%  . Palpitations   . PVC's (premature ventricular contractions)   . Right bundle branch block     Patient Active Problem List   Diagnosis Date Noted  . Bradycardia, drug induced 08/03/2015  . Mild pulmonary hypertension (HCC) 02/07/2015  . Paroxysmal atrial fibrillation (HCC) 10/18/2014  . Atrial fibrillation   . HTN (hypertension)   . PVC's (premature ventricular contractions)   . Right bundle branch block     Past Surgical History:  Procedure Laterality Date  . none    . TONSILLECTOMY      OB History    No data available       Home Medications    Prior to Admission medications   Medication Sig Start Date End Date Taking? Authorizing Provider   aspirin EC 81 MG tablet Take 1 tablet (81 mg total) by mouth daily. 02/17/16  Yes Lennette Biharihomas A Kelly, MD  Aspirin-Acetaminophen-Caffeine (GOODY HEADACHE PO) Take 1 tablet by mouth daily as needed (pain/ headache/soreness).    Yes Historical Provider, MD  atenolol (TENORMIN) 50 MG tablet Takes 1.5 tablets ( 75 mg) in the morning and 50 mg in the pm Patient taking differently: Take 75-100 mg by mouth See admin instructions. 100 mg in the morning and 75 mg at bedtime 12/01/15  Yes Lennette Biharihomas A Kelly, MD  B Complex Vitamins (VITAMIN-B COMPLEX) TABS Take 1 tablet by mouth every other day.    Yes Historical Provider, MD  Coenzyme Q10 (COQ10 PO) Take 1 tablet by mouth every Monday, Wednesday, and Friday.    Yes Historical Provider, MD  diltiazem (CARDIZEM) 30 MG tablet Take 1 tablet (30 mg total) by mouth as needed. Patient taking differently: Take 30 mg by mouth See admin instructions. Up to four times a day as needed for fast heart rate 03/12/13  Yes Lennette Biharihomas A Kelly, MD  lisinopril (PRINIVIL,ZESTRIL) 40 MG tablet TAKE ONE TABLET BY MOUTH ONCE DAILY 07/22/14  Yes Lennette Biharihomas A Kelly, MD  LORazepam (ATIVAN) 1 MG tablet Take 1 mg by mouth 3 (three) times daily as needed for anxiety.    Yes Historical Provider, MD  amLODipine (NORVASC) 5 MG tablet Take 1 tablet (5 mg total)  by mouth at bedtime. Patient not taking: Reported on 04/01/2016 02/17/16   Lennette Bihari, MD    Family History Family History  Problem Relation Age of Onset  . Aneurysm Mother   . Heart disease Father     Social History Social History  Substance Use Topics  . Smoking status: Former Smoker    Quit date: 09/01/1976  . Smokeless tobacco: Never Used  . Alcohol use 0.0 oz/week     Comment: occassional     Allergies   Review of patient's allergies indicates no known allergies.   Review of Systems Review of Systems  Constitutional: Negative for chills and fever.  HENT: Negative for congestion and rhinorrhea.   Eyes: Negative for redness and  visual disturbance.  Respiratory: Negative for shortness of breath and wheezing.   Cardiovascular: Positive for near-syncope. Negative for chest pain and palpitations.  Gastrointestinal: Negative for nausea and vomiting.  Genitourinary: Negative for dysuria and urgency.  Musculoskeletal: Negative for arthralgias and myalgias.  Skin: Negative for pallor and wound.  Neurological: Positive for syncope. Negative for dizziness and headaches.     Physical Exam Updated Vital Signs BP 163/75 (BP Location: Right Arm)   Pulse 61   Temp 98 F (36.7 C) (Oral)   Resp 15   Ht 5\' 7"  (1.702 m)   Wt 140 lb (63.5 kg)   SpO2 100%   BMI 21.93 kg/m   Physical Exam  Constitutional: She is oriented to person, place, and time. She appears well-developed and well-nourished. No distress.  HENT:  Head: Normocephalic and atraumatic.  Eyes: EOM are normal. Pupils are equal, round, and reactive to light.  Neck: Normal range of motion. Neck supple.  Cardiovascular: An irregularly irregular rhythm present. Tachycardia present.  Exam reveals no gallop and no friction rub.   No murmur heard. Pulmonary/Chest: Effort normal. She has no wheezes. She has no rales.  Abdominal: Soft. She exhibits no distension. There is no tenderness.  Musculoskeletal: She exhibits no edema or tenderness.  Neurological: She is alert and oriented to person, place, and time.  Skin: Skin is warm. She is diaphoretic.  Psychiatric: She has a normal mood and affect. Her behavior is normal.  Nursing note and vitals reviewed.    ED Treatments / Results  Labs (all labs ordered are listed, but only abnormal results are displayed) Labs Reviewed  BASIC METABOLIC PANEL - Abnormal; Notable for the following:       Result Value   Glucose, Bld 149 (*)    GFR calc non Af Amer 55 (*)    All other components within normal limits  TSH  CBC WITH DIFFERENTIAL/PLATELET  MAGNESIUM  I-STAT TROPOININ, ED    EKG  EKG  Interpretation  Date/Time:  Thursday April 14 2016 19:15:49 EDT Ventricular Rate:  89 PR Interval:    QRS Duration: 128 QT Interval:  363 QTC Calculation: 442 R Axis:   4 Text Interpretation:  Sinus rhythm Atrial premature complex Consider left atrial enlargement Right bundle branch block since last tracing afib resolved Otherwise no significant change Confirmed by Adela Lank MD, DANIEL [phone]) on 04/14/2016 7:41:11 PM       Radiology Dg Chest Port 1 View  Result Date: 04/14/2016 CLINICAL DATA:  Syncope, elevated heart rate, atrial fibrillation. EXAM: PORTABLE CHEST 1 VIEW COMPARISON:  Chest x-ray dated 08/03/2015. FINDINGS: Heart size is normal, stable. Overall cardiomediastinal silhouette appears stable in size and configuration. Atherosclerotic changes again noted at the aortic arch. Lungs appear clear, partially obscured  by overlying pacer pad and wires. No pleural effusion or pneumothorax seen. Osseous structures about the chest are unremarkable. IMPRESSION: 1. No active disease.  Lungs appear clear.  Heart size is normal. 2. Aortic atherosclerosis. Electronically Signed   By: Bary RichardStan  Maynard M.D.   On: 04/14/2016 19:19    Procedures Procedures (including critical care time)  Medications Ordered in ED Medications  diltiazem (CARDIZEM) 1 mg/mL load via infusion 20 mg (not administered)    And  diltiazem (CARDIZEM) 100 mg in dextrose 5% 100mL (1 mg/mL) infusion (not administered)  sodium chloride 0.9 % bolus 1,000 mL (0 mLs Intravenous Stopped 04/14/16 2104)     Initial Impression / Assessment and Plan / ED Course  I have reviewed the triage vital signs and the nursing notes.  Pertinent labs & imaging results that were available during my care of the patient were reviewed by me and considered in my medical decision making (see chart for details).  Clinical Course    75 yo F With a chief complaint of A. fib with RVR. This resolved spontaneously with urination. Patient had multiple  episodes of an asystolic event lasting about 4-6 seconds. These all resolved spontaneously. I discussed this with cardiology will come and evaluate the patient.  Patient felt the need to leave, discussed that her symptoms could result in disability or death due to stopping of her heart without a pacemaker.  Patient understands and will call her cardiologist in the morning.   11:53 PM:  I have discussed the diagnosis/risks/treatment options with the patient and family and believe the pt to be eligible for discharge home to follow-up with Cards. We also discussed returning to the ED immediately if new or worsening sx occur. We discussed the sx which are most concerning (e.g., repeat symptoms)  that necessitate immediate return. Medications administered to the patient during their visit and any new prescriptions provided to the patient are listed below.  Medications given during this visit Medications  diltiazem (CARDIZEM) 1 mg/mL load via infusion 20 mg (not administered)    And  diltiazem (CARDIZEM) 100 mg in dextrose 5% 100mL (1 mg/mL) infusion (not administered)  sodium chloride 0.9 % bolus 1,000 mL (0 mLs Intravenous Stopped 04/14/16 2104)     The patient appears reasonably screen and/or stabilized for discharge and I doubt any other medical condition or other [facility] requiring further screening, evaluation, or treatment in the ED at this time prior to discharge.      Final Clinical Impressions(s) / ED Diagnoses   Final diagnoses:  Near syncope  Asystole [facility](HCC)  Atrial fibrillation with rapid ventricular response [facility](HCC)    New Prescriptions New Prescriptions   No medications on file     Melene PlanDan Dhani Imel, DO 04/14/16 2353

## 2016-04-14 NOTE — ED Notes (Signed)
Dr Adela LankFloyd in to see patient

## 2016-04-14 NOTE — ED Notes (Signed)
Patient on the bedpan to voided - voided clear yellow urine. Immediately after voicing, patient converted to NSR.  Stated she could feel her heart changing

## 2016-04-14 NOTE — ED Notes (Signed)
Pt waited for Cardiology but states she can no longer wait; Pt walked out AMA with daughter at side; IV removed prior to pt leaving; Md notified

## 2016-04-14 NOTE — ED Notes (Signed)
Pt refusing to allow RN to replace cardiac monitor; Pt made aware that Dr.Floyd wants her to wait until she is seen by Cardiology; Pt states if Cardiology is not here by 10 pm she will be leaving AMA; Pt ware of risk; daughter currently at bedside and witness to pt behavior;

## 2016-04-14 NOTE — Telephone Encounter (Signed)
Returned patient call.  Pt having episode of palpitations, no other symptoms. Notes her HR was ~130 when checked, BP of 144/86. She has PRN diltiazem 30mg , which she takes for these episodes.  She was reluctant to take due to having an episode of syncope and brady 1.5 weeks ago after she took 4x 30mg  tablets over 2 hrs for tachycardia/palpitations.  However, she informed me that 10 mins before my call to her today, she had already taken 1x 30mg  tablet. I advised at this point to wait and observe, see if palpitations resolve/HR improves w 1x diltiazem dose. Patient aware she may call back later for after-hours on-call line - If not improved/symptomatic for SOB, new symptoms, pt aware she should go to ER. She also may take 2nd dose but I advised to wait for direction on this. I did not have a provider in office to address question further. She is aware she may call tomorrow if remaining concerns and that we can work to get her seen in A Fib clinic or by APP in office.

## 2016-04-14 NOTE — ED Notes (Signed)
Family inquiring about pt lab work; Pt states she is ready to go home and go to bed; Md notified

## 2016-04-14 NOTE — ED Notes (Signed)
Patient continuously stating she is going home even if she has to go AMA.  Daughter at bedside

## 2016-04-14 NOTE — Telephone Encounter (Signed)
New Message  Patient c/o Palpitations:  High priority if patient c/o lightheadedness and shortness of breath.  1. How long have you been having palpitations? 30 minutes  2. Are you currently experiencing lightheadedness and shortness of breath? No  3. Have you checked your BP and heart rate? (document readings) No, but she has it ready to be checked.  4. Are you experiencing any other symptoms? No  Pt voiced heart is racing, told to call next time when she experience palpitations.  Pt also voiced she takes diltiazem 30mg  and she took too many last time and she passed out.  Pt voiced if she needs to take the medicine or what.  Please follow up with pt. Thanks!

## 2016-04-14 NOTE — ED Triage Notes (Signed)
Pt. Was at home and began to feel   Like she was going to pass out.  When paramedics arrive  Her Heart Rate was elevated  St , 155 and then she went into an A-fib with a long pause , at that time she felt like she was going to pass outl.  Pt. Denies any chest pain sob n/v. Skin is pink, warm and dry.  Pt. Is alert and oriented X4

## 2016-04-18 ENCOUNTER — Telehealth (HOSPITAL_COMMUNITY): Payer: Self-pay | Admitting: *Deleted

## 2016-04-18 NOTE — Telephone Encounter (Signed)
I cld pt since she was recently in the ED for Afib with RVR 04/14/16;  Pt left AMA at that time and is scheduled for a f/u with Dr. Tresa EndoKelly on 09/06.  I offered pt follow up with our office since she is not on a blood thinner other than ASA 81.  Pt declined appt, stated that she would not go on a blood thinner and will just switch back to a 325 mg  ASA until she sees Dr. Tresa EndoKelly.

## 2016-05-04 ENCOUNTER — Ambulatory Visit: Payer: PPO | Admitting: Cardiovascular Disease

## 2016-05-16 NOTE — Telephone Encounter (Signed)
Agree; can try to work in in September ov if opening occurs.

## 2016-06-01 ENCOUNTER — Emergency Department (EMERGENCY_DEPARTMENT_HOSPITAL)
Admission: EM | Admit: 2016-06-01 | Discharge: 2016-06-01 | Discharge status: Against Medical Advice | Payer: PPO | Source: Home / Self Care | Attending: Emergency Medicine | Admitting: Emergency Medicine

## 2016-06-01 ENCOUNTER — Encounter (HOSPITAL_COMMUNITY): Payer: Self-pay

## 2016-06-01 DIAGNOSIS — I4891 Unspecified atrial fibrillation: Secondary | ICD-10-CM | POA: Diagnosis not present

## 2016-06-01 DIAGNOSIS — R55 Syncope and collapse: Secondary | ICD-10-CM | POA: Diagnosis not present

## 2016-06-01 DIAGNOSIS — Z79899 Other long term (current) drug therapy: Secondary | ICD-10-CM

## 2016-06-01 DIAGNOSIS — I10 Essential (primary) hypertension: Secondary | ICD-10-CM

## 2016-06-01 DIAGNOSIS — I495 Sick sinus syndrome: Secondary | ICD-10-CM | POA: Insufficient documentation

## 2016-06-01 DIAGNOSIS — Z87891 Personal history of nicotine dependence: Secondary | ICD-10-CM | POA: Insufficient documentation

## 2016-06-01 DIAGNOSIS — I455 Other specified heart block: Secondary | ICD-10-CM

## 2016-06-01 DIAGNOSIS — Z7982 Long term (current) use of aspirin: Secondary | ICD-10-CM | POA: Insufficient documentation

## 2016-06-01 LAB — BASIC METABOLIC PANEL
Anion gap: 8 (ref 5–15)
BUN: 11 mg/dL (ref 6–20)
CHLORIDE: 105 mmol/L (ref 101–111)
CO2: 28 mmol/L (ref 22–32)
CREATININE: 0.93 mg/dL (ref 0.44–1.00)
Calcium: 9.5 mg/dL (ref 8.9–10.3)
GFR calc non Af Amer: 59 mL/min — ABNORMAL LOW (ref >60)
Glucose, Bld: 109 mg/dL — ABNORMAL HIGH (ref 65–99)
POTASSIUM: 3.9 mmol/L (ref 3.5–5.1)
SODIUM: 141 mmol/L (ref 135–145)

## 2016-06-01 LAB — CBC
HCT: 43.5 % (ref 36.0–46.0)
Hemoglobin: 14.2 g/dL (ref 12.0–15.0)
MCH: 30.6 pg (ref 26.0–34.0)
MCHC: 32.6 g/dL (ref 30.0–36.0)
MCV: 93.8 fL (ref 78.0–100.0)
Platelets: 224 10*3/uL (ref 150–400)
RBC: 4.64 MIL/uL (ref 3.87–5.11)
RDW: 12.6 % (ref 11.5–15.5)
WBC: 7.6 10*3/uL (ref 4.0–10.5)

## 2016-06-01 NOTE — Consult Note (Signed)
CARDIOLOGY CONSULT NOTE  Patient ID: Emily [name], MRN: [id number], DOB/AGE: 76/01/1941 76 y.o. Admit date: 06/01/2016 Date of Consult: 06/01/2016  Primary Physician: Abigail MiyamotoPERRY,LAWRENCE EDWARD, MD Primary Cardiologist: Dr Tresa EndoKelly Referring Physician: Dr Senaida LangeSchosman  Chief Complaint: Near-syncope Reason for Consultation: Atrial fibrillation with post-termination pauses  HPI: 75 yo woman with hx of paroxysmal atrial fibrillation presents with atrial fibrillation and near-syncope. The patient has been treated with atenolol and uses diltiazem 30 mg as needed for episodes of atrial fibrillation. She has had multiple episodes of near-syncope and was in the emergency room in early August of this year with frank syncope. Episodes of typically occurred after she doesn't atrial fibrillation and then takes Cardizem.  At the time of this evaluation, she developed tachycardia palpitations this afternoon. Her heart rate was elevated and she took 90 mg of diltiazem. She then had lightheaded spells. She was transported by EMS and in route she converted from atrial flutter with 2-1 block to sinus rhythm and had at least a 5 second posttermination Renae Fickleaul is. She did not have any symptoms associated with this by her report. She now feels fine, is back in sinus rhythm, and is insistent on going home.  She denies any chest pain, chest pressure, shortness of breath, edema, orthopnea, or PND. Anticoagulation has been discussed with her in the past and she has declined.  Medical History:  Past Medical History:  Diagnosis Date  . Atrial fibrillation (HCC)   . HTN (hypertension)    11/20/12 -ECHO  EF 55-60%  . Palpitations   . PVC's (premature ventricular contractions)   . Right bundle branch block       Surgical History:  Past Surgical History:  Procedure Laterality Date  . none    . TONSILLECTOMY       Home Meds: Prior to Admission medications   Medication Sig Start Date End Date Taking? Authorizing Provider   aspirin 325 MG EC tablet Take 325 mg by mouth daily.   Yes Historical Provider, MD  atenolol (TENORMIN) 50 MG tablet Takes 1.5 tablets ( 75 mg) in the morning and 50 mg in the pm Patient taking differently: Take 75-100 mg by mouth See admin instructions. 100 mg in the morning and 75 mg at bedtime 12/01/15  Yes Lennette Biharihomas A Kelly, MD  B Complex Vitamins (VITAMIN-B COMPLEX) TABS Take 1 tablet by mouth daily as needed (takes occassionally).    Yes Historical Provider, MD  Coenzyme Q10 (COQ10 PO) Take 1 tablet by mouth daily as needed (takes occassionally).    Yes Historical Provider, MD  diltiazem (CARDIZEM) 30 MG tablet Take 1 tablet (30 mg total) by mouth as needed. Patient taking differently: Take 30 mg by mouth See admin instructions. Up to four times a day as needed for fast heart rate 03/12/13  Yes Lennette Biharihomas A Kelly, MD  lisinopril (PRINIVIL,ZESTRIL) 40 MG tablet TAKE ONE TABLET BY MOUTH ONCE DAILY 07/22/14  Yes Lennette Biharihomas A Kelly, MD  LORazepam (ATIVAN) 1 MG tablet Take 1 mg by mouth 3 (three) times daily as needed for anxiety.    Yes Historical Provider, MD  amLODipine (NORVASC) 5 MG tablet Take 1 tablet (5 mg total) by mouth at bedtime. Patient not taking: Reported on 06/01/2016 02/17/16   Lennette Biharihomas A Kelly, MD  aspirin EC 81 MG tablet Take 1 tablet (81 mg total) by mouth daily. Patient not taking: Reported on 06/01/2016 02/17/16   Lennette Biharihomas A Kelly, MD    Inpatient Medications:     Allergies:  No Known Allergies  Social History   Social History  . Marital status: Married    Spouse name: N/A  . Number of children: 3  . Years of education: N/A   Occupational History  . [employer]    Social History Main Topics  . Smoking status: Former Smoker    Quit date: 09/01/1976  . Smokeless tobacco: Never Used  . Alcohol use 0.0 oz/week     Comment: occassional  . Drug use: No  . Sexual activity: Not on file   Other Topics Concern  . Not on file   Social History Narrative   Cares for her  husband who is disabled.     Family History  Problem Relation Age of Onset  . Aneurysm Mother   . Heart disease Father      Review of Systems: General: negative for chills, fever, night sweats or weight changes.  ENT: negative for rhinorrhea or epistaxis Cardiovascular: see HPI Dermatological: negative for rash Respiratory: negative for cough or wheezing GI: negative for nausea, vomiting, diarrhea, bright red blood per rectum, melena, or hematemesis GU: no hematuria, urgency, or frequency Neurologic: negative for visual changes, headache Heme: no easy bruising or bleeding Endo: negative for excessive thirst, thyroid disorder, or flushing Musculoskeletal: negative for joint pain or swelling, negative for myalgias  All other systems reviewed and are otherwise negative except as noted above.  Physical Exam: Blood pressure 152/79, pulse (!) 59, temperature 98.4 F (36.9 C), temperature source Oral, resp. rate 12, height 5\' 7"  (1.702 m), weight 140 lb (63.5 kg), SpO2 99 %. Pt is alert and oriented, WD, WN, in no distress. HEENT: normal Neck: JVP normal. Carotid upstrokes normal without bruits. No thyromegaly. Lungs: equal expansion, clear bilaterally CV: Apex is discrete and nondisplaced, RRR without murmur or gallop Abd: soft, NT, +BS, no bruit, no hepatosplenomegaly Back: no CVA tenderness Ext: no C/C/E        DP/PT pulses intact and = Skin: warm and dry without rash Neuro: CNII-XII intact             Strength intact = bilaterally    Labs: No results for input(s): CKTOTAL, CKMB, TROPONINI in the last 72 hours. Lab Results  Component Value Date   WBC 7.6 06/01/2016   HGB 14.2 06/01/2016   HCT 43.5 06/01/2016   MCV 93.8 06/01/2016   PLT 224 06/01/2016    Recent Labs Lab 06/01/16 1804  NA 141  K 3.9  CL 105  CO2 28  BUN 11  CREATININE 0.93  CALCIUM 9.5  GLUCOSE 109*   No results found for: CHOL, HDL, LDLCALC, TRIG No results found for:  DDIMER  Radiology/Studies:  No results found.  EKG: sinus rhythm with RBBB, 64 bpm, no change from previous. Rhythm strips show atrial flutter with 2:1 block HR 138 bpm, then at least a 5 s pause before sinus rhythm (onset not captured)  Cardiac Studies: 2D Echo 01-01-2015: Study Conclusions  - Left ventricle: The cavity size was normal. Systolic function was   normal. The estimated ejection fraction was in the range of 55%   to 60%. - Aortic valve: There was mild regurgitation. - Pulmonary arteries: PA peak pressure: 34 mm Hg (S).  ASSESSMENT AND PLAN:  Atrial fibrillation/atrial flutter with RVR and posttermination pauses with associated syncope and near syncope. Reviewed the significance of this with the patient. She understands she will require formal EP evaluation and likely placement of a permanent pacemaker. She inquires about atrial fib  ablation. Advised that hospital admission and inpatient EP evaluation would be most reasonable. She is insistent on going home. Will notify the EP service so that she can have outpatient evaluation performed promptly. She was advised not to take when necessary Cardizem in order to not further exacerbate her posttermination pauses.  This patients CHA2DS2-VASc Score and unadjusted Ischemic Stroke Rate (% per year) is equal to 4.8 % stroke rate/year from a score of 4  Above score calculated as 1 point each if present [CHF, HTN, DM, Vascular=MI/PAD/Aortic Plaque, Age if 65-74, or Female] Above score calculated as 2 points each if present [Age > 75, or Stroke/TIA/TE]  The patient has declined anticoagulation based on review of outpatient notes. Will need to be addressed again pending her EP evaluation.   SignedTonny Bollman MD, [facility] 06/01/2016, 6:35 PM

## 2016-06-01 NOTE — ED Provider Notes (Signed)
[facility]-EMERGENCY DEPT Provider Note   CSN: [id number] Arrival date & time: 06/01/16  1617     History   Chief Complaint Chief Complaint  Patient presents with  . Near Syncope    HPI Emily [name] is a 75 y.o. female.  HPI   Took normal meds this AM, atenolol. Was upset last night as dog was sick.  6AM woke up with palpitations, had coffee and it seemed better. Took medicine and was hoping it would calm down and it didn't. Took BP and HR and it was 120 something. HR 138. Called EMS but they said they couldn't give meds and she refused transport. then took diltiazem 30mg , then took it 2 more times over 2 hours.  Then called ambulance  Felt like going to pass out then did. Felt like going to pass out a few times and did have syncope once. No chest pain, no shortness of breath.  No recent illness.  Have appt with Dr. Tresa EndoKelly in November.     Past Medical History:  Diagnosis Date  . Atrial fibrillation (HCC)   . HTN (hypertension)    11/20/12 -ECHO  EF 55-60%  . Palpitations   . PVC's (premature ventricular contractions)   . Right bundle branch block     Patient Active Problem List   Diagnosis Date Noted  . Syncope   . Bradycardia, drug induced 08/03/2015  . Mild pulmonary hypertension 02/07/2015  . Paroxysmal atrial fibrillation (HCC) 10/18/2014  . Atrial fibrillation with RVR (HCC)   . HTN (hypertension)   . PVC's (premature ventricular contractions)   . Right bundle branch block     Past Surgical History:  Procedure Laterality Date  . none    . TONSILLECTOMY      OB History    No data available       Home Medications    Prior to Admission medications   Medication Sig Start Date End Date Taking? Authorizing Provider  aspirin 325 MG EC tablet Take 325 mg by mouth daily.   Yes Historical Provider, MD  atenolol (TENORMIN) 50 MG tablet Takes 1.5 tablets ( 75 mg) in the morning and 50 mg in the pm Patient taking differently: Take 75-100 mg by mouth See admin  instructions. 100 mg in the morning and 75 mg at bedtime 12/01/15  Yes Lennette Biharihomas A Kelly, MD  B Complex Vitamins (VITAMIN-B COMPLEX) TABS Take 1 tablet by mouth daily as needed (takes occassionally).    Yes Historical Provider, MD  Coenzyme Q10 (COQ10 PO) Take 1 tablet by mouth daily as needed (takes occassionally).    Yes Historical Provider, MD  diltiazem (CARDIZEM) 30 MG tablet Take 1 tablet (30 mg total) by mouth as needed. Patient taking differently: Take 30 mg by mouth See admin instructions. Up to four times a day as needed for fast heart rate 03/12/13  Yes Lennette Biharihomas A Kelly, MD  lisinopril (PRINIVIL,ZESTRIL) 40 MG tablet TAKE ONE TABLET BY MOUTH ONCE DAILY 07/22/14  Yes Lennette Biharihomas A Kelly, MD  LORazepam (ATIVAN) 1 MG tablet Take 1 mg by mouth 3 (three) times daily as needed for anxiety.    Yes Historical Provider, MD  amLODipine (NORVASC) 5 MG tablet Take 1 tablet (5 mg total) by mouth at bedtime. Patient not taking: Reported on 06/01/2016 02/17/16   Lennette Biharihomas A Kelly, MD  aspirin EC 81 MG tablet Take 1 tablet (81 mg total) by mouth daily. Patient not taking: Reported on 06/01/2016 02/17/16   Lennette Biharihomas A Kelly, MD  Family History Family History  Problem Relation Age of Onset  . Aneurysm Mother   . Heart disease Father     Social History Social History  Substance Use Topics  . Smoking status: Former Smoker    Quit date: 09/01/1976  . Smokeless tobacco: Never Used  . Alcohol use 0.0 oz/week     Comment: occassional     Allergies   Review of patient's allergies indicates no known allergies.   Review of Systems Review of Systems  Constitutional: Negative for fever.  HENT: Negative for sore throat.   Eyes: Negative for visual disturbance.  Respiratory: Negative for cough and shortness of breath.   Cardiovascular: Positive for palpitations. Negative for chest pain.  Gastrointestinal: Negative for abdominal pain, nausea and vomiting.  Genitourinary: Negative for difficulty urinating.    Musculoskeletal: Negative for back pain and neck pain.  Skin: Negative for rash.  Neurological: Positive for syncope and light-headedness. Negative for headaches.     Physical Exam Updated Vital Signs BP 152/73 (BP Location: Right Arm)   Pulse 63   Temp 98.4 F (36.9 C) (Oral)   Resp 14   Ht 5\' 7"  (1.702 m)   Wt 140 lb (63.5 kg)   SpO2 97%   BMI 21.93 kg/m   Physical Exam  Constitutional: She is oriented to person, place, and time. She appears well-developed and well-nourished. No distress.  HENT:  Head: Normocephalic and atraumatic.  Eyes: Conjunctivae and EOM are normal.  Neck: Normal range of motion.  Cardiovascular: Normal rate, regular rhythm, normal heart sounds and intact distal pulses.  Exam reveals no gallop and no friction rub.   No murmur heard. Pulmonary/Chest: Effort normal and breath sounds normal. No respiratory distress. She has no wheezes. She has no rales.  Abdominal: Soft. She exhibits no distension. There is no tenderness. There is no guarding.  Musculoskeletal: She exhibits no edema or tenderness.  Neurological: She is alert and oriented to person, place, and time.  Skin: Skin is warm and dry. No rash noted. She is not diaphoretic. No erythema.  Nursing note and vitals reviewed.    ED Treatments / Results  Labs (all labs ordered are listed, but only abnormal results are displayed) Labs Reviewed  BASIC METABOLIC PANEL - Abnormal; Notable for the following:       Result Value   Glucose, Bld 109 (*)    GFR calc non Af Amer 59 (*)    All other components within normal limits  CBC    EKG  EKG Interpretation  Date/Time:  Wednesday June 01 2016 16:33:27 EDT Ventricular Rate:  64 PR Interval:    QRS Duration: 129 QT Interval:  419 QTC Calculation: 433 R Axis:   -18 Text Interpretation:  Sinus rhythm Right bundle branch block No significant change since last tracing Confirmed by [facility] MD, Tamirah George ([id number]) on 06/01/2016 6:33:34 PM        Radiology No results found.  Procedures Procedures (including critical care time)  Medications Ordered in ED Medications - No data to display   Initial Impression / Assessment and Plan / ED Course  I have reviewed the triage vital signs and the nursing notes.  Pertinent labs & imaging results that were available during my care of the patient were reviewed by me and considered in my medical decision making (see chart for details).  Clinical Course   75 year old female a history of PVCs, paroxysmal atrial fibrillation, right bundle branch block, hypertension, who presents with concern for syncopal episodes  after having episodes of atrial fibrillation with RVR, and taking 3 total 30 mg tablets of diltiazem at home.  Patient initially did not want transport, however when she was being transported by EMS, was initially in atrial fibrillation with RVR then had 3 episodes of sinus pause consecutively followed by conversion to sinus bradycardia.    On arrival to the emergency department, patient is in a normal sinus rhythm and is requesting discharge. Call cardiology for consultation, Dr. Excell Seltzer came to the bedside and discussed episodes of sinus positive patient. Dr. Excell Seltzer offered admission and pacemaker placement tomorrow, however patient declines. I my evaluation, again recommended inpatient evaluation, however patient declines and would like to leave AGAINST MEDICAL ADVICE. She understands that she is at risk of death. She'll follow up with EP cardiologist as discussed with Dr. Excell Seltzer as an outpatient .  Final Clinical Impressions(s) / ED Diagnoses   Final diagnoses:  Syncope, unspecified syncope type  Sinus pause  Atrial fibrillation with RVR [facility])    New Prescriptions Discharge Medication List as of 06/01/2016  6:29 PM       Alvira Monday, MD 06/02/16 [phone]

## 2016-06-01 NOTE — ED Triage Notes (Signed)
Pt brought in EMS for near syncopal episode and Afib.  Pt called EMS earlier today and was in Afib RVR at 130's but refused transport.  PT took 90mg  of home Cardizem and reports having near syncopal episode aprox. 1 hr after.  Pt reports she didn't fall her family reported "she just wouldn't answer us."  Pt was in afib with RVR upon EMS arrival and self converted after sinus pause PTA.

## 2016-06-01 NOTE — ED Notes (Signed)
Cardiology at bedside.

## 2016-06-02 ENCOUNTER — Telehealth (HOSPITAL_COMMUNITY): Payer: Self-pay | Admitting: *Deleted

## 2016-06-02 ENCOUNTER — Encounter (HOSPITAL_COMMUNITY): Payer: Self-pay | Admitting: Nurse Practitioner

## 2016-06-02 ENCOUNTER — Ambulatory Visit (HOSPITAL_BASED_OUTPATIENT_CLINIC_OR_DEPARTMENT_OTHER)
Admission: RE | Admit: 2016-06-02 | Discharge: 2016-06-02 | Disposition: A | Discharge status: Home / Self Care | Payer: PPO | Source: Ambulatory Visit | Attending: Nurse Practitioner | Admitting: Nurse Practitioner

## 2016-06-02 VITALS — BP 136/80 | HR 59 | Ht 67.0 in | Wt 137.4 lb

## 2016-06-02 DIAGNOSIS — I480 Paroxysmal atrial fibrillation: Secondary | ICD-10-CM | POA: Insufficient documentation

## 2016-06-02 DIAGNOSIS — Z87891 Personal history of nicotine dependence: Secondary | ICD-10-CM | POA: Insufficient documentation

## 2016-06-02 DIAGNOSIS — Z950 Presence of cardiac pacemaker: Secondary | ICD-10-CM | POA: Insufficient documentation

## 2016-06-02 DIAGNOSIS — I4891 Unspecified atrial fibrillation: Secondary | ICD-10-CM | POA: Diagnosis not present

## 2016-06-02 DIAGNOSIS — Z7982 Long term (current) use of aspirin: Secondary | ICD-10-CM

## 2016-06-02 DIAGNOSIS — I10 Essential (primary) hypertension: Secondary | ICD-10-CM | POA: Diagnosis not present

## 2016-06-02 DIAGNOSIS — Z79899 Other long term (current) drug therapy: Secondary | ICD-10-CM | POA: Insufficient documentation

## 2016-06-02 DIAGNOSIS — I4510 Unspecified right bundle-branch block: Secondary | ICD-10-CM

## 2016-06-02 DIAGNOSIS — R55 Syncope and collapse: Secondary | ICD-10-CM

## 2016-06-02 DIAGNOSIS — I495 Sick sinus syndrome: Secondary | ICD-10-CM

## 2016-06-02 HISTORY — DX: Sick sinus syndrome: I49.5

## 2016-06-02 MED ORDER — APIXABAN 5 MG PO TABS: 5.0000 mg | ORAL_TABLET | Freq: Two times a day (BID) | ORAL | 0 refills | Status: DC

## 2016-06-02 NOTE — Progress Notes (Addendum)
Primary Care Physician: Abigail MiyamotoPERRY,LAWRENCE EDWARD, MD Primary Cardiologist: Dr Tresa EndoKelly Primary Electrophysiologist: previously Dr Graciela HusbandsKlein (has not seen him in years) Referring Physician: Dr Wylene Simmerooper   Emily [name] is a 75 y.o. female with a history of paroxysmal atrial fibrillation who presents for consultation in the [facility].  The patient was initially diagnosed with atrial fibrillation before 2010 after presenting with symptoms of  tachypalpitations.  In retrospect, she has had afib for quite some time prior to that.  She had afib about once per year.  Over the past year, episodes have increased in frequency and duration.  This year, she thinks that she has had afib 4-5 times.  She has not required cardioversion.  Episodes typically terminate after several hours.  She has had post termination pauses recently.  She had syncope in August and also yesterday.  Yesterday, she was documented to have a 6 second pause with the episode.    Today, she denies symptoms of palpitations, chest pain, shortness of breath, orthopnea, PND, lower extremity edema, dizziness, presyncope, further syncope, snoring, daytime somnolence, bleeding, or neurologic sequela. The patient is tolerating medications without difficulties and is otherwise without complaint today.    Atrial Fibrillation Risk Factors:  she does not have symptoms or diagnosis of sleep apnea.  she does not have a history of rheumatic fever.  she does not have a history of alcohol use.  she has a BMI of Body mass index is 21.52 kg/m.Marland Kitchen. Filed Weights   06/02/16 1517  Weight: 137 lb 6.4 oz (62.3 kg)    LA size: 41mm   Atrial Fibrillation Management history:  Previous antiarrhythmic drugs: none  Previous cardioversions: none  Previous ablations: none  CHADS2VASC score: 4  Anticoagulation history: declines anticoagulation   Past Medical History:  Diagnosis Date  . Atrial fibrillation (HCC)   . HTN  (hypertension)    11/20/12 -ECHO  EF 55-60%  . PVC's (premature ventricular contractions)   . Right bundle branch block   . Tachy-brady syndrome [facility](HCC)    Past Surgical History:  Procedure Laterality Date  . TONSILLECTOMY      Current Outpatient Prescriptions  Medication Sig Dispense Refill  . aspirin 325 MG EC tablet Take 325 mg by mouth daily. Taking intermittently    . aspirin EC 81 MG tablet Take 1 tablet (81 mg total) by mouth daily. 90 tablet 3  . atenolol (TENORMIN) 50 MG tablet Takes 1.5 tablets ( 75 mg) in the morning and 50 mg in the pm (Patient taking differently: Take 50 mg by mouth See admin instructions. 100 mg in the morning and 75 mg at bedtime) 75 tablet 11  . B Complex Vitamins (VITAMIN-B COMPLEX) TABS Take 1 tablet by mouth daily as needed (takes occassionally).     . Coenzyme Q10 (COQ10 PO) Take 1 tablet by mouth daily as needed (takes occassionally).     Marland Kitchen. diltiazem (CARDIZEM) 30 MG tablet Take 1 tablet (30 mg total) by mouth as needed. 30 tablet 6  . lisinopril (PRINIVIL,ZESTRIL) 40 MG tablet TAKE ONE TABLET BY MOUTH ONCE DAILY 30 tablet 0  . LORazepam (ATIVAN) 1 MG tablet Take 1 mg by mouth 3 (three) times daily as needed for anxiety.      No current facility-administered medications for this encounter.     No Known Allergies  Social History   Social History  . Marital status: Married    Spouse name: N/A  . Number of children: 3  .  Years of education: N/A   Occupational History  . [employer]ra[employer]    Social History Main Topics  . Smoking status: Former Smoker    Quit date: 09/01/1976  . Smokeless tobacco: Never Used  . Alcohol use 0.0 oz/week     Comment: occassional  . Drug use: No  . Sexual activity: Not on file   Other Topics Concern  . Not on file   Social History Narrative   Lives in [place].  Cares for her husband who is disabled.   Retired from Designer, fashion/clothing       Family History  Problem Relation Age of Onset  . Aneurysm Mother     . Heart disease Father    The patient does not have a history of early familial atrial fibrillation or other arrhythmias.  ROS- All systems are reviewed and negative except as per the HPI above.  Physical Exam: Vitals:   06/02/16 1517  BP: 136/80  Pulse: (!) 59  Weight: 137 lb 6.4 oz (62.3 kg)  Height: 5\' 7"  (1.702 m)    GEN- The patient is well appearing, alert and oriented x 3 today.   Head- normocephalic, atraumatic Eyes-  Sclera clear, conjunctiva pink Ears- hearing intact Oropharynx- clear Neck- supple  Lungs- Clear to ausculation bilaterally, normal work of breathing Heart- Regular rate and rhythm, no murmurs, rubs or gallops  GI- soft, NT, ND, + BS Extremities- no clubbing, cyanosis, or edema MS- no significant deformity or atrophy Skin- no rash or lesion Psych- euthymic mood, full affect Neuro- strength and sensation are intact  Wt Readings from Last 3 Encounters:  06/02/16 137 lb 6.4 oz (62.3 kg)  06/01/16 140 lb (63.5 kg)  04/14/16 140 lb (63.5 kg)    EKG today demonstrates sinus bradycardia 59 bpm, PR 162 msec, QRS 120 msec, incomplete RBBB, Qtc 417 msec Echo 20165 demonstrated preserved EF, normal LA size, no significant valvular disease  Epic records are reviewed at length today EMS strips are reviewed and document a 6 second post termination pause  Assessment and Plan:  1.  atrial fibrillation The patient has Symptomatic paroxysmal atrial fibrillation.  The patients CHAD2VASC score is 4.  she is not  appropriately anticoagulated at this time as she has declined anticoagulation. The patient is adequately rate controlled with diltiazem and metorolol. Antiarrhythmic therapy to dates has included none.  AAD therapy is limited by sinus pauses.  The patients left atrial size is normal.  Additional echo findings include preserved EF, no significant valvular disease. A long discussion with the patient was had today regarding therapeutic strategies.  Extensive  discussion of lifestyle modification was also discussed.  Presently, our recommendations include: Today, I discussed Coumadin as well as novel anticoagulants including Pradaxa, Xarelto, Savaysa, and Eliquis today as indicated for risk reduction in stroke and systemic emboli with nonvalvular atrial fibrillation.  Risks, benefits, and alternatives to each of these drugs were discussed at length today.  She is now willing to start eliquis.  I will therefore stop ASA and start eliquis 5mg  BID.  Therapeutic strategies for afib including medicine and ablation were discussed in detail with the patient today.  We discussed symptomatic AV block with pauses.  I have recommended PPM implantation (either transvenous or leadless).  She is clear that she would like to avoid pacemaker implantation.  She would prefer to have her "afib fixed".  Risk, benefits, and alternatives to EP study and radiofrequency ablation for afib were also discussed in detail today. These risks  include but are not limited to stroke, bleeding, vascular damage, tamponade, perforation, damage to the esophagus, lungs, and other structures, pulmonary vein stenosis, worsening renal function, and death. The patient understands these risk and wishes to proceed.  We will therefore proceed with catheter ablation once the patient has been adequately anticoagulated. She will have cardiac CT within 1 week of ablation. Obtain an echo.  2. Sinus node dysfunction As above Post termination pauses discussed at length today.  I have advised PPM implantation.  She declines, stating that she would prefer to have afib ablation.  This may be reasonable, though she is aware that success with ablation is not 100% and that recurrences are not uncommon and could lead to recurrent syncope/ injury.  I have advised leadless pacemaker (MICRA) with subsequent ablation.  She is clear that she will decline PPM at this time.  She may be more willing to consider if she has  further episodes. I have been very clear with the patient and her family today that DMV requires no driving x 6 months given her syncope.  3. HTN Stable No change required today   This is a very complex patient situation.  She is at risk for decompensation, arrhythmia, death.  A high level of decision making was required for this encounter.  Emily Range, MD 06/02/2016 3:33 PM

## 2016-06-02 NOTE — Patient Instructions (Addendum)
Your physician has recommended you make the following change in your medication:  1)Stop Aspirin 2)Start Eliquis 5mg  twice a day -- do NOT miss any doses of this.   Your physician has recommended that you have an ablation. Catheter ablation is a medical procedure used to treat some cardiac arrhythmias (irregular heartbeats). During catheter ablation, a long, thin, flexible tube is put into a blood vessel in your groin (upper thigh), or neck. This tube is called an ablation catheter. It is then guided to your heart through the blood vessel. Radio frequency waves destroy small areas of heart tissue where abnormal heartbeats may cause an arrhythmia to start. Please see the instruction sheet given to you today.  Ablation scheduled for Tuesday, November 2nd  - Arrive at the [facility] and go to admitting at 930AM  -Do not eat or drink anything after midnight the night prior to your procedure.  - Take no medications the morning of the procedure.  - You will stay overnight and be discharged the next morning.  A scheduler will be in touch with you in regards to the cardiac CT - it will be 1 week prior to procedure.   NO driving

## 2016-06-02 NOTE — Telephone Encounter (Signed)
Pt scheduled to come in this afternoon.

## 2016-06-03 ENCOUNTER — Encounter (HOSPITAL_COMMUNITY): Payer: Self-pay | Admitting: Emergency Medicine

## 2016-06-03 ENCOUNTER — Other Ambulatory Visit (HOSPITAL_COMMUNITY): Payer: Self-pay | Admitting: *Deleted

## 2016-06-03 ENCOUNTER — Encounter (HOSPITAL_COMMUNITY)
Admission: EM | Disposition: A | Discharge status: Home / Self Care | Payer: Self-pay | Source: Home / Self Care | Attending: Cardiology

## 2016-06-03 ENCOUNTER — Inpatient Hospital Stay (HOSPITAL_COMMUNITY)
Admission: EM | Admit: 2016-06-03 | Discharge: 2016-06-04 | DRG: 243 | Disposition: A | Discharge status: Home / Self Care | Payer: PPO | Attending: Cardiology | Admitting: Cardiology

## 2016-06-03 DIAGNOSIS — R001 Bradycardia, unspecified: Secondary | ICD-10-CM | POA: Diagnosis present

## 2016-06-03 DIAGNOSIS — I495 Sick sinus syndrome: Principal | ICD-10-CM

## 2016-06-03 DIAGNOSIS — Z87891 Personal history of nicotine dependence: Secondary | ICD-10-CM

## 2016-06-03 DIAGNOSIS — I4892 Unspecified atrial flutter: Secondary | ICD-10-CM | POA: Diagnosis present

## 2016-06-03 DIAGNOSIS — I4891 Unspecified atrial fibrillation: Secondary | ICD-10-CM | POA: Diagnosis present

## 2016-06-03 DIAGNOSIS — I10 Essential (primary) hypertension: Secondary | ICD-10-CM | POA: Diagnosis present

## 2016-06-03 DIAGNOSIS — I469 Cardiac arrest, cause unspecified: Secondary | ICD-10-CM

## 2016-06-03 DIAGNOSIS — Z8249 Family history of ischemic heart disease and other diseases of the circulatory system: Secondary | ICD-10-CM

## 2016-06-03 DIAGNOSIS — Z95818 Presence of other cardiac implants and grafts: Secondary | ICD-10-CM

## 2016-06-03 HISTORY — DX: Bradycardia, unspecified: R00.1

## 2016-06-03 HISTORY — PX: EP IMPLANTABLE DEVICE: SHX172B

## 2016-06-03 HISTORY — DX: Sick sinus syndrome: I49.5

## 2016-06-03 LAB — CBC WITH DIFFERENTIAL/PLATELET
BASOS ABS: 0 10*3/uL (ref 0.0–0.1)
BASOS PCT: 0 %
Eosinophils Absolute: 0.1 10*3/uL (ref 0.0–0.7)
Eosinophils Relative: 1 %
HEMATOCRIT: 43.2 % (ref 36.0–46.0)
HEMOGLOBIN: 14.1 g/dL (ref 12.0–15.0)
LYMPHS PCT: 24 %
Lymphs Abs: 1.7 10*3/uL (ref 0.7–4.0)
MCH: 30.7 pg (ref 26.0–34.0)
MCHC: 32.6 g/dL (ref 30.0–36.0)
MCV: 93.9 fL (ref 78.0–100.0)
Monocytes Absolute: 0.8 10*3/uL (ref 0.1–1.0)
Monocytes Relative: 11 %
NEUTROS ABS: 4.7 10*3/uL (ref 1.7–7.7)
NEUTROS PCT: 64 %
Platelets: 191 10*3/uL (ref 150–400)
RBC: 4.6 MIL/uL (ref 3.87–5.11)
RDW: 12.7 % (ref 11.5–15.5)
WBC: 7.3 10*3/uL (ref 4.0–10.5)

## 2016-06-03 LAB — BASIC METABOLIC PANEL
ANION GAP: 11 (ref 5–15)
BUN: 21 mg/dL — ABNORMAL HIGH (ref 6–20)
CALCIUM: 9.5 mg/dL (ref 8.9–10.3)
CHLORIDE: 106 mmol/L (ref 101–111)
CO2: 23 mmol/L (ref 22–32)
Creatinine, Ser: 1.05 mg/dL — ABNORMAL HIGH (ref 0.44–1.00)
GFR calc non Af Amer: 51 mL/min — ABNORMAL LOW (ref >60)
GFR, EST AFRICAN AMERICAN: 59 mL/min — AB (ref >60)
Glucose, Bld: 122 mg/dL — ABNORMAL HIGH (ref 65–99)
Potassium: 4.1 mmol/L (ref 3.5–5.1)
Sodium: 140 mmol/L (ref 135–145)

## 2016-06-03 LAB — CBG MONITORING, ED: Glucose-Capillary: 114 mg/dL — ABNORMAL HIGH (ref 65–99)

## 2016-06-03 LAB — TSH: TSH: 1.815 u[IU]/mL (ref 0.350–4.500)

## 2016-06-03 LAB — MAGNESIUM: Magnesium: 1.9 mg/dL (ref 1.7–2.4)

## 2016-06-03 SURGERY — PACEMAKER IMPLANT
Anesthesia: LOCAL

## 2016-06-03 MED ORDER — SODIUM CHLORIDE 0.9 % IV SOLN
INTRAVENOUS | Status: DC
  Administered 2016-06-03: 14:00:00 via INTRAVENOUS

## 2016-06-03 MED ORDER — ONDANSETRON HCL 4 MG/2ML IJ SOLN: 4.0000 mg | Freq: Four times a day (QID) | INTRAMUSCULAR | Status: DC | PRN

## 2016-06-03 MED ORDER — SODIUM CHLORIDE 0.9 % IV SOLN
250.0000 mL | INTRAVENOUS | Status: DC
Start: 2016-06-03 — End: 2016-06-03

## 2016-06-03 MED ORDER — SODIUM CHLORIDE 0.9% FLUSH: 3.0000 mL | INTRAVENOUS | Status: DC | PRN

## 2016-06-03 MED ORDER — APIXABAN 5 MG PO TABS
5.0000 mg | ORAL_TABLET | Freq: Two times a day (BID) | ORAL | Status: DC
  Administered 2016-06-04: 5 mg via ORAL
  Filled 2016-06-03: qty 1

## 2016-06-03 MED ORDER — ACETAMINOPHEN 325 MG PO TABS: 325.0000 mg | ORAL_TABLET | ORAL | Status: DC | PRN

## 2016-06-03 MED ORDER — SODIUM CHLORIDE 0.9 % IV BOLUS (SEPSIS)
1000.0000 mL | Freq: Once | INTRAVENOUS | Status: AC
  Administered 2016-06-03: 1000 mL via INTRAVENOUS

## 2016-06-03 MED ORDER — LISINOPRIL 40 MG PO TABS
40.0000 mg | ORAL_TABLET | Freq: Every day | ORAL | Status: DC
  Administered 2016-06-03 – 2016-06-04 (×2): 40 mg via ORAL
  Filled 2016-06-03 (×2): qty 1

## 2016-06-03 MED ORDER — INFLUENZA VAC SPLIT QUAD 0.5 ML IM SUSY
0.5000 mL | PREFILLED_SYRINGE | INTRAMUSCULAR | Status: DC
  Filled 2016-06-03: qty 0.5

## 2016-06-03 MED ORDER — LORAZEPAM 1 MG PO TABS
1.0000 mg | ORAL_TABLET | Freq: Three times a day (TID) | ORAL | Status: DC | PRN
  Administered 2016-06-03: 1 mg via ORAL
  Filled 2016-06-03: qty 1

## 2016-06-03 MED ORDER — LIDOCAINE HCL (PF) 1 % IJ SOLN
INTRAMUSCULAR | Status: DC | PRN
  Administered 2016-06-03: 45 mL via INTRADERMAL

## 2016-06-03 MED ORDER — ASPIRIN EC 81 MG PO TBEC: 81.0000 mg | DELAYED_RELEASE_TABLET | Freq: Every day | ORAL | Status: DC

## 2016-06-03 MED ORDER — ACETAMINOPHEN 325 MG PO TABS: 650.0000 mg | ORAL_TABLET | ORAL | Status: DC | PRN

## 2016-06-03 MED ORDER — CEFAZOLIN SODIUM-DEXTROSE 2-4 GM/100ML-% IV SOLN
2.0000 g | INTRAVENOUS | Status: AC
  Administered 2016-06-03: 2 g via INTRAVENOUS
  Filled 2016-06-03: qty 100

## 2016-06-03 MED ORDER — SODIUM CHLORIDE 0.9 % IR SOLN
Status: AC
  Filled 2016-06-03: qty 2

## 2016-06-03 MED ORDER — NITROGLYCERIN 0.4 MG SL SUBL: 0.4000 mg | SUBLINGUAL_TABLET | SUBLINGUAL | Status: DC | PRN

## 2016-06-03 MED ORDER — FENTANYL CITRATE (PF) 100 MCG/2ML IJ SOLN
INTRAMUSCULAR | Status: DC | PRN
  Administered 2016-06-03 (×2): 12.5 ug via INTRAVENOUS
  Administered 2016-06-03: 25 ug via INTRAVENOUS

## 2016-06-03 MED ORDER — DILTIAZEM HCL ER COATED BEADS 240 MG PO CP24
240.0000 mg | ORAL_CAPSULE | Freq: Every day | ORAL | Status: DC
  Administered 2016-06-03 – 2016-06-04 (×2): 240 mg via ORAL
  Filled 2016-06-03 (×2): qty 1

## 2016-06-03 MED ORDER — CHLORHEXIDINE GLUCONATE 4 % EX LIQD: 60.0000 mL | Freq: Once | CUTANEOUS | Status: DC

## 2016-06-03 MED ORDER — ATENOLOL 100 MG PO TABS
100.0000 mg | ORAL_TABLET | Freq: Two times a day (BID) | ORAL | Status: DC
  Administered 2016-06-03 – 2016-06-04 (×2): 100 mg via ORAL
  Filled 2016-06-03: qty 1
  Filled 2016-06-03 (×2): qty 4
  Filled 2016-06-03: qty 1

## 2016-06-03 MED ORDER — FENTANYL CITRATE (PF) 100 MCG/2ML IJ SOLN
INTRAMUSCULAR | Status: AC
  Filled 2016-06-03: qty 2

## 2016-06-03 MED ORDER — HEPARIN (PORCINE) IN NACL 2-0.9 UNIT/ML-% IJ SOLN
INTRAMUSCULAR | Status: DC | PRN
  Administered 2016-06-03: 500 mL

## 2016-06-03 MED ORDER — LIDOCAINE HCL (PF) 1 % IJ SOLN
INTRAMUSCULAR | Status: AC
  Filled 2016-06-03: qty 60

## 2016-06-03 MED ORDER — DILTIAZEM HCL 100 MG IV SOLR: 5.0000 mg/h | INTRAVENOUS | Status: DC

## 2016-06-03 MED ORDER — DILTIAZEM LOAD VIA INFUSION
20.0000 mg | Freq: Once | INTRAVENOUS | Status: DC
  Filled 2016-06-03: qty 20

## 2016-06-03 MED ORDER — CEFAZOLIN SODIUM-DEXTROSE 2-4 GM/100ML-% IV SOLN
INTRAVENOUS | Status: AC
  Filled 2016-06-03: qty 100

## 2016-06-03 MED ORDER — SODIUM CHLORIDE 0.9 % IR SOLN
80.0000 mg | Status: AC
  Administered 2016-06-03: 80 mg
  Filled 2016-06-03: qty 2

## 2016-06-03 MED ORDER — CEFAZOLIN IN D5W 1 GM/50ML IV SOLN
1.0000 g | Freq: Four times a day (QID) | INTRAVENOUS | Status: AC
  Administered 2016-06-03 – 2016-06-04 (×3): 1 g via INTRAVENOUS
  Filled 2016-06-03 (×3): qty 50

## 2016-06-03 MED ORDER — MIDAZOLAM HCL 5 MG/5ML IJ SOLN
INTRAMUSCULAR | Status: DC | PRN
  Administered 2016-06-03 (×4): 1 mg via INTRAVENOUS

## 2016-06-03 MED ORDER — SODIUM CHLORIDE 0.9% FLUSH
3.0000 mL | Freq: Two times a day (BID) | INTRAVENOUS | Status: DC
Start: 2016-06-03 — End: 2016-06-03

## 2016-06-03 MED ORDER — MIDAZOLAM HCL 5 MG/5ML IJ SOLN
INTRAMUSCULAR | Status: AC
  Filled 2016-06-03: qty 5

## 2016-06-03 SURGICAL SUPPLY — 7 items
CABLE SURGICAL S-101-97-12 (CABLE) ×2 IMPLANT
LEAD CAPSURE NOVUS 5076-52CM (Lead) ×2 IMPLANT
LEAD CAPSURE NOVUS 5076-58CM (Lead) ×2 IMPLANT
PAD DEFIB LIFELINK (PAD) ×2 IMPLANT
PPM ADVISA MRI DR A2DR01 (Pacemaker) ×2 IMPLANT
SHEATH CLASSIC 7F (SHEATH) ×4 IMPLANT
TRAY PACEMAKER INSERTION (PACKS) ×2 IMPLANT

## 2016-06-03 NOTE — Plan of Care (Signed)
Problem: Education: Goal: Knowledge of [facility] Education information/materials will improve Outcome: Completed/Met Date Met: 06/03/16 Patient was given information regarding how the unit works, where everything is and who to contact if she needs something outside of the nurse or charge nurse on the unit and patient verbalized understanding.

## 2016-06-03 NOTE — H&P (Addendum)
Please refer to Consult note by Dr. Elberta Fortisamnitz for H&P.  Emily Dowseenee Ursuy, PA-C  Loman BrooklynWill Jazlin Tapscott, MD

## 2016-06-03 NOTE — Plan of Care (Signed)
Problem: Safety: Goal: Ability to remain free from injury will improve Outcome: Completed/Met Date Met: 06/03/16 Patient instructed to use her call light when she needs assistance and it is in her bed where she can ss and reach it. Patient instructed that her bed alarm would n=be ppalced on at night to remind her not to get OOB without calling and she is fine with that and verbalized understanding.

## 2016-06-03 NOTE — Consult Note (Signed)
ELECTROPHYSIOLOGY CONSULT NOTE    Patient ID: Emily [name] MRN: [id number], DOB/AGE: 76/01/1941 76 y.o.  Admit date: 06/03/2016 Date of Consult: 06/03/2016   Primary Physician: Abigail MiyamotoPERRY,LAWRENCE EDWARD, MD Primary Cardiologist: Dr. Tresa EndoKelly  Reason for Consultation: evaluate RAFib, pauses  HPI: Emily CravenMary T [name] is a 75 y.o. female with PMHx of AFib/flutter, HTN and known RBBB with recent history of recurrent near syncope and syncope, whow was recently here (06/01/16), with c/o tachy- palpitations, took her PRN cardizem then weak and lightheaded, she was observed by EMS to have converted from atrial flutter with 2-1 block to sinus rhythm and had at least a 5 second posttermination pause.   At that time the patient was advised to be admitted/stay for formal EP consult though was insistant about leaving, and out patient arrangements were made for early f/u.  She saw Dr. [name] yesterday at that time, the patient felt strongly she wanted to try and avoid PPM implant (traditional or leadless) and wanted to pursue AF ablation as a first option and was started on Eliquis.  She tells me she has had an irregular fast HR dating back 20 years, started on atenolol back then by her PMD that controlled it until about a year ago.  This morning she woke with her heart racing, she took her routine Atenolol and her lisinopril as usual but not the PRN diltiazem.  She called 911 when the racing persisted, not until in the ambulance and hee did she feel the lightheaded spells again.  NO near syncope or syncope, though dose have this history.  No CP or SOB.  She is emotional this morning in the ED, mentions she just wants these episodes to stop and is more open to the idea of the pacer.  She would prefer the idea of the leadless device, but not certain she wants to go home and come back as an out patient (would need to be Tuesday) given her symptoms are coming daily and more severe when she is having them.  Discussed  traditional PPM implant today, she would like to give some thought to this, and talk further with Dr. Elberta Fortisamnitz.  While talking with her she did have a few shorter pauses 3-4 seconds that she was aware of, but not near syncopal.  LABS: K+ 4.1 BUN/Creat 21/1.05 Mag 1.9 H/H 14/43 WBC 7.3 plts 191 TSH is ordered  Past Medical History:  Diagnosis Date  . Atrial fibrillation (HCC)   . HTN (hypertension)    11/20/12 -ECHO  EF 55-60%  . PVC's (premature ventricular contractions)   . Right bundle branch block   . Tachy-brady syndrome [facility](HCC)      Surgical History:  Past Surgical History:  Procedure Laterality Date  . TONSILLECTOMY        (Not in a hospital admission)  Inpatient Medications:   Allergies: No Known Allergies  Social History   Social History  . Marital status: Married    Spouse name: N/A  . Number of children: 3  . Years of education: N/A   Occupational History  . thera[employer] alternatives    Social History Main Topics  . Smoking status: Former Smoker    Quit date: 09/01/1976  . Smokeless tobacco: Never Used  . Alcohol use 0.0 oz/week     Comment: occassional  . Drug use: No  . Sexual activity: Not on file   Other Topics Concern  . Not on file   Social History Narrative   [place] in [place]  Cares for her husband who is disabled.   Retired from Designer, fashion/clothing        Family History  Problem Relation Age of Onset  . Aneurysm Mother   . Heart disease Father      Review of Systems: All other systems reviewed and are otherwise negative except as noted above.  Physical Exam: Vitals:   06/03/16 0707  BP: 133/78  Pulse: (!) 143  Resp: 21  Temp: 98.2 F (36.8 C)  TempSrc: Oral  SpO2: 100%  Weight: 138 lb (62.6 kg)  Height: 5\' 7"  (1.702 m)    GEN- The patient is well appearing, alert and oriented x 3 today.   HEENT: normocephalic, atraumatic; sclera clear, conjunctiva pink; hearing intact; oropharynx clear; neck supple, no JVP Lymph- no cervical  lymphadenopathy Lungs- Clear to ausculation bilaterally, normal work of breathing.  No wheezes, rales, rhonchi Heart- IRRR, tachycardic, no murmurs, rubs or gallops, PMI not laterally displaced GI- soft, non-tender, non-distended, bowel sounds present Extremities- no clubbing, cyanosis, or edema MS- no significant deformity or atrophy Skin- warm and dry, no rash or lesion Psych- tearful/emotioal, full affect Neuro- no gross deficits observed  Labs:   Lab Results  Component Value Date   WBC 7.3 06/03/2016   HGB 14.1 06/03/2016   HCT 43.2 06/03/2016   MCV 93.9 06/03/2016   PLT 191 06/03/2016    Recent Labs Lab 06/03/16 0710  NA 140  K 4.1  CL 106  CO2 23  BUN 21*  CREATININE 1.05*  CALCIUM 9.5  GLUCOSE 122*      Radiology/Studies: No results found.  EKG: AFlutter, 2:1, RBBB, LAFB, 141bpm TELEMETRY: AFib/flutter 120's-140's, pauses, post termination, followed by sinus beat, up to 5 seconds observed, occurring intermittently since here.  01/01/15: TTE Study Conclusions - Left ventricle: The cavity size was normal. Systolic function was   normal. The estimated ejection fraction was in the range of 55%   to 60%. - Aortic valve: There was mild regurgitation. - Pulmonary arteries: PA peak pressure: 34 mm Hg (S).   Assessment and Plan:   1. AFib/flutter with RVR     CHA2DS2Vasc is at least 4, started on Eliquis yesterday  2. Post termination pauses, as long as 5 seconds noted in ER, several shorter     Recommend PPM     Traditional vs leadless d/w her yesterday with [name]     Pending further d/w Dr. Elberta Fortis today on timing and type.      Norma Fredrickson, PA-C 06/03/2016 8:44 AM   I have seen and examined this patient with Francis Dowse.  Agree with above, note added to reflect my findings.  On exam, tachycardic, no murmurs, lungs clear.  Hx of AF now with post termination pauses and near syncope.  Plan for dual chamber pacemaker placement.  Risks and  beenfits discussed. Risks include but are not limited to bleeding, tamponade, infection, pneumothorax.  The patient understands the risks and has agreed to the procedure.  Was initially planned for AF ablation, plan for delay of that procedure due to pacemaker placement for tachy-brady syndrome.    Kimoni Pagliarulo M. Keishawna Carranza MD 06/03/2016 11:39 AM

## 2016-06-03 NOTE — ED Provider Notes (Signed)
[facility]Y DEPT Provider Note   CSN: [id number] Arrival date & time: 06/03/16  4098     History   Chief Complaint Chief Complaint  Patient presents with  . Dizziness  . Lightheaded    HPI Emily [name] is a 75 y.o. female.  The history is provided by the patient.  Dizziness  Quality:  Lightheadedness Severity:  Moderate Onset quality:  Gradual Duration:  4 hours Timing:  Constant Progression:  Unchanged Chronicity:  Recurrent Context: not with loss of consciousness (but lost consciousness 2 days ago, kneeled down)   Relieved by:  Nothing Worsened by:  Nothing Ineffective treatments: home cardizem, did not take today. Associated symptoms: palpitations   Risk factors comment:  Atrial fibrillation   Past Medical History:  Diagnosis Date  . Atrial fibrillation (HCC)   . HTN (hypertension)    11/20/12 -ECHO  EF 55-60%  . PVC's (premature ventricular contractions)   . Right bundle branch block   . Tachy-brady syndrome [facility])     Patient Active Problem List   Diagnosis Date Noted  . Syncope   . Bradycardia, drug induced 08/03/2015  . Mild pulmonary hypertension 02/07/2015  . Paroxysmal atrial fibrillation (HCC) 10/18/2014  . Atrial fibrillation with RVR (HCC)   . HTN (hypertension)   . PVC's (premature ventricular contractions)   . Right bundle branch block     Past Surgical History:  Procedure Laterality Date  . TONSILLECTOMY      OB History    No data available       Home Medications    Prior to Admission medications   Medication Sig Start Date End Date Taking? Authorizing Provider  apixaban (ELIQUIS) 5 MG TABS tablet Take 1 tablet (5 mg total) by mouth 2 (two) times daily. 06/02/16   Hillis Range, MD  atenolol (TENORMIN) 50 MG tablet Takes 1.5 tablets ( 75 mg) in the morning and 50 mg in the pm Patient taking differently: Take 50 mg by mouth See admin instructions. 100 mg in the morning and 75 mg at bedtime 12/01/15   Lennette Bihari, MD  B  Complex Vitamins (VITAMIN-B COMPLEX) TABS Take 1 tablet by mouth daily as needed (takes occassionally).     Historical Provider, MD  Coenzyme Q10 (COQ10 PO) Take 1 tablet by mouth daily as needed (takes occassionally).     Historical Provider, MD  diltiazem (CARDIZEM) 30 MG tablet Take 1 tablet (30 mg total) by mouth as needed. 03/12/13   Lennette Bihari, MD  lisinopril (PRINIVIL,ZESTRIL) 40 MG tablet TAKE ONE TABLET BY MOUTH ONCE DAILY 07/22/14   Lennette Bihari, MD  LORazepam (ATIVAN) 1 MG tablet Take 1 mg by mouth 3 (three) times daily as needed for anxiety.     Historical Provider, MD    Family History Family History  Problem Relation Age of Onset  . Aneurysm Mother   . Heart disease Father     Social History Social History  Substance Use Topics  . Smoking status: Former Smoker    Quit date: 09/01/1976  . Smokeless tobacco: Never Used  . Alcohol use 0.0 oz/week     Comment: occassional     Allergies   Review of patient's allergies indicates no known allergies.   Review of Systems Review of Systems  Cardiovascular: Positive for palpitations.  Neurological: Positive for dizziness.  All other systems reviewed and are negative.    Physical Exam Updated Vital Signs BP 133/78 (BP Location: Right Arm)   Pulse (!) 143  Temp 98.2 F (36.8 C) (Oral)   Resp 21   Ht 5\' 7"  (1.702 m)   Wt 138 lb (62.6 kg)   SpO2 100%   BMI 21.61 kg/m   Physical Exam  Constitutional: She is oriented to person, place, and time. She appears well-developed and well-nourished. No distress.  HENT:  Head: Normocephalic.  Nose: Nose normal.  Eyes: Conjunctivae are normal.  Neck: Neck supple. No tracheal deviation present.  Cardiovascular: Regular rhythm and normal heart sounds.  Tachycardia present.   Occasional systolic pauses of several seconds  Pulmonary/Chest: Effort normal. No respiratory distress.  Abdominal: Soft. She exhibits no distension.  Neurological: She is alert and oriented to  person, place, and time.  Skin: Skin is warm and dry.  Psychiatric: She has a normal mood and affect.     ED Treatments / Results  Labs (all labs ordered are listed, but only abnormal results are displayed) Labs Reviewed  BASIC METABOLIC PANEL - Abnormal; Notable for the following:       Result Value   Glucose, Bld 122 (*)    BUN 21 (*)    Creatinine, Ser 1.05 (*)    GFR calc non Af Amer 51 (*)    GFR calc Af Amer 59 (*)    All other components within normal limits  CBG MONITORING, ED - Abnormal; Notable for the following:    Glucose-Capillary 114 (*)    All other components within normal limits  CBC WITH DIFFERENTIAL/PLATELET  MAGNESIUM  TSH    EKG  EKG Interpretation  Date/Time:  Friday June 03 2016 07:05:30 EDT Ventricular Rate:  141 PR Interval:    QRS Duration: 119 QT Interval:  314 QTC Calculation: 481 R Axis:   -41 Text Interpretation:  Atrial flutter with predominant 2:1 AV block with rapid ventricular response New since previous tracing RBBB and LAFB Confirmed by Israel Wunder MD, Kamyla Olejnik ([id number]) on 06/03/2016 7:09:11 AM       Radiology No results found.  Procedures Procedures (including critical care time)  CRITICAL CARE Performed by: Lyndal PulleyKnott, Meriam Chojnowski Total critical care time: 30 minutes Critical care time was exclusive of separately billable procedures and treating other patients. Critical care was necessary to treat or prevent imminent or life-threatening deterioration. Critical care was time spent personally by me on the following activities: development of treatment plan with patient and/or surrogate as well as nursing, discussions with consultants, evaluation of patient's response to treatment, examination of patient, obtaining history from patient or surrogate, ordering and performing treatments and interventions, ordering and review of laboratory studies, ordering and review of radiographic studies, pulse oximetry and re-evaluation of patient's  condition.   Medications Ordered in ED Medications  sodium chloride 0.9 % bolus 1,000 mL (0 mLs Intravenous Stopped 06/03/16 0938)     Initial Impression / Assessment and Plan / ED Course  I have reviewed the triage vital signs and the nursing notes.  Pertinent labs & imaging results that were available during my care of the patient were reviewed by me and considered in my medical decision making (see chart for details).  Clinical Course    75 y.o. female presents with Near syncopal episodes at home where she feels fast palpitations and feels like she might pass out. 2 days ago she did lose consciousness after kneeling to the ground and feeling lightheaded for a brief period of time.  On arrival she is in atrial flutter with rapid ventricular rate that has post conversion asystolic pauses of 5-10 seconds which  were captured on telemetry. No calcium channel blockers administered with fear of prolonging pauses and precipitating symptoms or possible arrest. Cardiology immediately consulted for emergent EP evaluation and pacer placement which patient is amenable to on this visit after signing out AGAINST MEDICAL ADVICE 2 days ago for the same.   Patient placed on zoll monitor for transcutaneous pacing if indicated and monitor closely for changes in hemodynamics in tenuous condition.  Pt to go to EP lab for emergent pacemaker placement.  Final Clinical Impressions(s) / ED Diagnoses   Final diagnoses:  Tachy-brady syndrome (HCC)  Asystole by electrocardiogram [facility])    New Prescriptions New Prescriptions   No medications on file     Lyndal Pulley, MD 06/03/16 1134

## 2016-06-03 NOTE — Discharge Instructions (Signed)
° ° °  Supplemental Discharge Instructions for  Pacemaker/Defibrillator Patients  Activity No heavy lifting or vigorous activity with your left/right arm for 6 to 8 weeks.  Do not raise your left/right arm above your head for one week.  Gradually raise your affected arm as drawn below.             06/07/16                   06/08/16                  06/09/16                06/10/16 __  NO DRIVING until cleared to  WOUND CARE - Keep the wound area clean and dry.  Do not get this area wet for one week. No showers for one week; you may shower on 06/10/16   . - The tape/steri-strips on your wound will fall off; do not pull them off.  No bandage is needed on the site.  DO  NOT apply any creams, oils, or ointments to the wound area. - If you notice any drainage or discharge from the wound, any swelling or bruising at the site, or you develop a fever > 101? F after you are discharged home, call the office at once.  Special Instructions - You are still able to use cellular telephones; use the ear opposite the side where you have your pacemaker/defibrillator.  Avoid carrying your cellular phone near your device. - When traveling through airports, show security personnel your identification card to avoid being screened in the metal detectors.  Ask the security personnel to use the hand wand. - Avoid arc welding equipment, MRI testing (magnetic resonance imaging), TENS units (transcutaneous nerve stimulators).  Call the office for questions about other devices. - Avoid electrical appliances that are in poor condition or are not properly grounded. - Microwave ovens are safe to be near or to operate.  Additional information for defibrillator patients should your device go off: - If your device goes off ONCE and you feel fine afterward, notify the device clinic nurses. - If your device goes off ONCE and you do not feel well afterward, call 911. - If your device goes off TWICE, call 911. - If your device  goes off THREE times in one day, call 911.  DO NOT DRIVE YOURSELF OR A FAMILY MEMBER WITH A DEFIBRILLATOR TO THE HOSPITAL--CALL 911.

## 2016-06-03 NOTE — Plan of Care (Signed)
Problem: Education: Goal: Knowledge of disease or condition will improve Outcome: Completed/Met Date Met: 06/03/16 Reviewed all D/C instructions and all questions answered, had patient tell me what were some signs of infection and she verbalized them without difficulty and also what to do if she has any of the signs. Patient given written instructions and told she will get more instruction with her D/C in th AM.

## 2016-06-03 NOTE — ED Notes (Signed)
Called Cath Lab and they stated they will not be ready for pt for another 1.5 hours.

## 2016-06-03 NOTE — ED Notes (Signed)
Attempted report RN on floor to call back

## 2016-06-03 NOTE — Plan of Care (Signed)
Problem: Pain Managment: Goal: General experience of comfort will improve Outcome: Completed/Met Date Met: 06/03/16 Patient is aware and able to describe if she has pain, where it is, what kind of pain it is, if it radiates and what helps to relieve it, patient has some soreness but is refusing any pain meds and is able to get comfortable with repositioning her arm in the sling and up on a pillow.

## 2016-06-03 NOTE — Discharge Summary (Signed)
ELECTROPHYSIOLOGY PROCEDURE DISCHARGE SUMMARY    Patient ID: Emily [name],  MRN: [id number], DOB/AGE: Apr 11, 1941 75 y.o.  Admit date: 06/03/2016 Discharge date: 06/04/2016  Primary Care Physician: Abigail Miyamoto, MD  Primary Cardiologist: Dr. Tresa Endo   Primary Discharge Diagnosis:  1. SInus node dysfunction 2. PAFib/flutter  w/RVR     CHA2DS2Vasc is 4, on Eliquis  Secondary Discharge Diagnosis:  1. HTN  No Known Allergies  Procedures This Admission:  1.  Implantation of a Medtronic Advisa DR MRI Sure Scan model [id number] (serial number [id number]) chamber PPM on 06/03/2016 by Dr Elberta Fortis. Initial atrial lead P- waves measured 2.64mV with impedance of 813 ohms and a threshold of 1.5 V at 0.5 msec.  Right ventricular lead R-waves measured 9.5 mV with an impedance of 1200 ohms and a threshold of 1.1 V at 0.5 msec.There were no immediate post procedure complications.  2.  CXR on 06/04/2016 demonstrated no pneumothorax status post device implantation.   Brief HPI: Emily [name] is a 75 y.o. female PMHx of AFib/flutter, HTN and known RBBB with recent history of recurrent near syncope and syncope, returned to the ER today with c/o tachy-palpitations and dizziness, noted to have recurrent pauses of 5 seconds.  Hospital Course:  The patient was recently here (06/01/16), with c/o tachy- palpitations, took her PRN Cardizem then weak and lightheaded, she was observed by EMS to have converted from atrial flutter with 2-1 block to sinus rhythm and had at least a 5 second posttermination pause. At that time the patient was advised to be admitted/stay for formal EP consult though was insistant about leaving, and out patient arrangements were made for early f/u.  She saw [name] yesterday at that time, the patient felt strongly she wanted to try and avoid PPM implant (traditional or leadless) and wanted to pursue AF ablation as a first option and was started on Eliquis.  Given this  known hx for her, and steadily increasing symptoms, she was agreeable to proceed with PPM.  She was admitted and underwent implantation of a PPM with details as outlined above. She was monitored on telemetry overnight which demonstrated NSR with appropriate sensing. Left chest was without hematoma or ecchymosis. The device was interrogated and found to be functioning normally. CXR was obtained and demonstrated no pneumothorax status post device implantation. Wound care, arm mobility, and restrictions were reviewed with the patient. The patient was examined by Dr. Ladona Ridgel and considered stable for discharge to home. Her rate control medicines were up-titrated in effort to better control her RAFib/flutter.  She will have f/u with [name] in 3 months to revisit ablation, wound check visit has been arranged.   Physical Exam: Vitals:   06/03/16 2100 06/03/16 2230 06/04/16 0449 06/04/16 0730  BP: 125/65 (!) 123/54 131/72 (!) 145/60  Pulse: 60 62 69 62  Resp: 14  18 16   Temp: 98.2 F (36.8 C)  98.4 F (36.9 C) 98.5 F (36.9 C)  TempSrc: Oral  Oral Oral  SpO2: 95%  98% 97%  Weight:   137 lb 3.2 oz (62.2 kg)   Height:        GEN- The patient is well appearing, alert and oriented x 3 today.   HEENT: normocephalic, atraumatic; sclera clear, conjunctiva pink; hearing intact; oropharynx clear; neck supple, no JVP Lungs- Clear to ausculation bilaterally, normal work of breathing.  No wheezes, rales, rhonchi Heart- Regular rate and rhythm, no murmurs, rubs or gallops, PMI not laterally displaced  GI- soft, non-tender, non-distended, bowel sounds present, no hepatosplenomegaly Extremities- no clubbing, cyanosis, or edema; DP/PT/radial pulses 2+ bilaterally MS- no significant deformity or atrophy Skin- warm and dry, no rash or lesion, left chest without hematoma/ecchymosis Psych- euthymic mood, full affect Neuro- no gross deficits   Labs: Lab Results  Component Value Date   WBC 7.3 06/03/2016    HGB 14.1 06/03/2016   HCT 43.2 06/03/2016   MCV 93.9 06/03/2016   PLT 191 06/03/2016     Recent Labs Lab 06/03/16 0710  NA 140  K 4.1  CL 106  CO2 23  BUN 21*  CREATININE 1.05*  CALCIUM 9.5  GLUCOSE 122*    Discharge Medications:    Medication List    TAKE these medications   apixaban 5 MG Tabs tablet Commonly known as:  ELIQUIS Take 1 tablet (5 mg total) by mouth 2 (two) times daily.   atenolol 100 MG tablet Commonly known as:  TENORMIN Take 1 tablet (100 mg total) by mouth 2 (two) times daily. What changed:  medication strength  how much to take  how to take this  when to take this  additional instructions   COQ10 PO Take 1 tablet by mouth daily as needed (takes occassionally).   diltiazem 240 MG 24 hr capsule Commonly known as:  CARDIZEM CD Take 1 capsule (240 mg total) by mouth daily.   diltiazem 30 MG tablet Commonly known as:  CARDIZEM Take 1 tablet (30 mg total) by mouth as needed. What changed:  reasons to take this   lisinopril 40 MG tablet Commonly known as:  PRINIVIL,ZESTRIL TAKE ONE TABLET BY MOUTH ONCE DAILY   LORazepam 1 MG tablet Commonly known as:  ATIVAN Take 1 mg by mouth 3 (three) times daily as needed for anxiety.   Vitamin-B Complex Tabs Take 1 tablet by mouth daily as needed (takes occassionally).       Disposition:  Home Discharge Instructions    Increase activity slowly    Complete by:  As directed      Follow-up Information    [facility] [address] [facility] Follow up on 06/16/2016.   Specialty:  Cardiology Why:  3:30PM, wound check Contact information: [address], [address] [place] [id number] [phone]       [facility] Follow up on 07/18/2016.   Specialty:  Cardiology Why:  at 9:30AM Contact information: [address] [id number] [place] [id number] [phone]          Duration of Discharge Encounter: Greater than  30 minutes including physician time.  Signed, Ellsworth LennoxBrittany M Strader, PA-C 06/04/2016, 9:36 AM Pager: [phone]  EP Attending Patient seen and examined. Agree with above. The patient is stable for DC. Followup as above.   Leonia ReevesGregg Caili Escalera,M.D.

## 2016-06-03 NOTE — ED Notes (Signed)
Emily [name], Cards PA, at bedside.  Pt still decided whether or not to have pace maker placed today, or wait until next week.

## 2016-06-03 NOTE — ED Triage Notes (Signed)
Pt. arrived with EMS from home woke up this morning with dizziness/lightheaded " almost passed out" , alert and oriented at arrival , history of AFib  , her cardiologist is Dr. Tresa EndoKelly.

## 2016-06-03 NOTE — Progress Notes (Signed)
Report received in patient's room via CJ RN using SBAR format, reviewed orders, labs, VS, meds and patient's general condition, assumed care of patient. 

## 2016-06-04 ENCOUNTER — Inpatient Hospital Stay (HOSPITAL_COMMUNITY): Payer: PPO

## 2016-06-04 MED ORDER — APIXABAN 5 MG PO TABS: 5.0000 mg | ORAL_TABLET | Freq: Two times a day (BID) | ORAL | 10 refills | Status: DC

## 2016-06-04 MED ORDER — APIXABAN 5 MG PO TABS: 5.0000 mg | ORAL_TABLET | Freq: Two times a day (BID) | ORAL | 0 refills | Status: DC

## 2016-06-04 MED ORDER — ATENOLOL 100 MG PO TABS: 100.0000 mg | ORAL_TABLET | Freq: Two times a day (BID) | ORAL | 6 refills | Status: DC

## 2016-06-04 MED ORDER — DILTIAZEM HCL ER COATED BEADS 240 MG PO CP24: 240.0000 mg | ORAL_CAPSULE | Freq: Every day | ORAL | 6 refills | Status: DC

## 2016-06-04 NOTE — Care Management Note (Signed)
Case Management Note  Patient Details  Name: Emily [name] MRN: [id number] Date of Birth: 10/02/1940  Subjective/Objective:                  Symptomatic bradycardia   Tachy-brady syndrome (HCC)  Action/Plan: CM spoke to patient at the bedside regarding her eliquis. 30 day free card provided and discussed the importance of taking the medication as directed and discussed bleeding risk.   Expected Discharge Date: 06/04/16                 Expected Discharge Plan:  Home/Self Care  In-House Referral:     Discharge planning Services  CM Consult, Medication Assistance  Post Acute Care Choice:    Choice offered to:  Patient  DME Arranged:    DME Agency:     HH Arranged:    HH Agency:     Status of Service:  Completed, signed off  If discussed at [employer] Length of Stay Meetings, dates discussed:    Additional Comments:  Darcel SmallingAnna C Posie Lillibridge, RN 06/04/2016, 10:28 AM

## 2016-06-04 NOTE — Progress Notes (Signed)
Discharge paperwork given with understanding. Pt is very apprehensive about taking eliquis. Drug education given. Educated on atrial fibrillation possibility of causing strokes if not taking a blood thinner. I did ask pt that if she decided not to take eliquis to please make the cardiologist aware. Emelda Brothershristy Da Michelle RN

## 2016-06-04 NOTE — Progress Notes (Signed)
Patient Name: Emily [name] Date of Encounter: 06/04/2016     Active Problems:   Symptomatic bradycardia   Tachy-brady syndrome (HCC)    SUBJECTIVE  No chest pain or sob. Minimal discomfort at PM insertion site.  CURRENT MEDS . apixaban  5 mg Oral BID  . atenolol  100 mg Oral BID  . diltiazem  240 mg Oral Daily  . Influenza vac split quadrivalent PF  0.5 mL Intramuscular Tomorrow-1000  . lisinopril  40 mg Oral Daily    OBJECTIVE  Vitals:   06/03/16 2100 06/03/16 2230 06/04/16 0449 06/04/16 0730  BP: 125/65 (!) 123/54 131/72 (!) 145/60  Pulse: 60 62 69 62  Resp: 14  18 16   Temp: 98.2 F (36.8 C)  98.4 F (36.9 C) 98.5 F (36.9 C)  TempSrc: Oral  Oral Oral  SpO2: 95%  98% 97%  Weight:   137 lb 3.2 oz (62.2 kg)   Height:        Intake/Output Summary (Last 24 hours) at 06/04/16 0912 Last data filed at 06/04/16 0222  Gross per 24 hour  Intake              520 ml  Output              250 ml  Net              270 ml   Filed Weights   06/03/16 0707 06/04/16 0449  Weight: 138 lb (62.6 kg) 137 lb 3.2 oz (62.2 kg)    PHYSICAL EXAM  General: Pleasant, NAD. Neuro: Alert and oriented X 3. Moves all extremities spontaneously. Psych: Normal affect. HEENT:  Normal  Neck: Supple without bruits or JVD. Lungs:  Resp regular and unlabored, CTA. Heart: RRR no s3, s4, or murmurs. Abdomen: Soft, non-tender, non-distended, BS + x 4.  Extremities: No clubbing, cyanosis or edema. DP/PT/Radials 2+ and equal bilaterally.  Accessory Clinical Findings  CBC  Recent Labs  06/01/16 1804 06/03/16 0710  WBC 7.6 7.3  NEUTROABS  --  4.7  HGB 14.2 14.1  HCT 43.5 43.2  MCV 93.8 93.9  PLT 224 191   Basic Metabolic Panel  Recent Labs  06/01/16 1804 06/03/16 0710  NA 141 140  K 3.9 4.1  CL 105 106  CO2 28 23  GLUCOSE 109* 122*  BUN 11 21*  CREATININE 0.93 1.05*  CALCIUM 9.5 9.5  MG  --  1.9   Liver Function Tests No results for input(s): AST, ALT, ALKPHOS,  BILITOT, PROT, ALBUMIN in the last 72 hours. No results for input(s): LIPASE, AMYLASE in the last 72 hours. Cardiac Enzymes No results for input(s): CKTOTAL, CKMB, CKMBINDEX, TROPONINI in the last 72 hours. BNP Invalid input(s): POCBNP D-Dimer No results for input(s): DDIMER in the last 72 hours. Hemoglobin A1C No results for input(s): HGBA1C in the last 72 hours. Fasting Lipid Panel No results for input(s): CHOL, HDL, LDLCALC, TRIG, CHOLHDL, LDLDIRECT in the last 72 hours. Thyroid Function Tests  Recent Labs  06/03/16 1033  TSH 1.815    TELE  nsr with appropriate sensing  Radiology/Studies  Dg Chest 2 View  Result Date: 06/04/2016 CLINICAL DATA:  Pacemaker placed yesterday. EXAM: CHEST  2 VIEW COMPARISON:  04/14/2016 FINDINGS: Dual lead pacemaker placed from a left subclavian approach. Leads are in the region of the right atrium and right ventricle. No pneumothorax. Heart size is normal. Aortic atherosclerosis again demonstrated. Linear scarring in both lower lobes is unchanged. No evidence  of heart failure or effusion. No bone abnormality. IMPRESSION: Good appearance following dual lead pacemaker placement. No pneumothorax or other active process. Electronically Signed   By: Paulina FusiMark  Shogry M.D.   On: 06/04/2016 07:36    ASSESSMENT AND PLAN  1. Symptomatic tachybrady - she is doing well s/p PPM. Ok for DC. Usual followup. 2. PPM - she is doing well s/p PPM insertion. Ok for discharge.  Sharlot GowdaGregg Anjeanette Petzold,M.D.  06/04/2016 9:12 AMPatient ID: Emily [name], female   DOB: 02/10/1941, 75 y.o.   MRN: [id number]

## 2016-06-04 NOTE — Progress Notes (Signed)
Patient's left chest dressing has the same small dried mark on it that was previously charted on but has not gotten any bigger. Patient kept her arm in the sling and her arm elevated on a pillow and rolled onto her right side or her back as was instructed, VSS and she denies pain, will continue to monitor.

## 2016-06-04 NOTE — Progress Notes (Signed)
Patient to X-ray via wheelchair with monitor in stable condition. Dressing remains unchanged from previous.

## 2016-06-06 ENCOUNTER — Ambulatory Visit (HOSPITAL_COMMUNITY): Payer: PPO | Admitting: Nurse Practitioner

## 2016-06-06 ENCOUNTER — Encounter (HOSPITAL_COMMUNITY): Payer: Self-pay | Admitting: Cardiology

## 2016-06-08 ENCOUNTER — Ambulatory Visit: Payer: PPO | Admitting: Cardiovascular Disease

## 2016-06-13 ENCOUNTER — Ambulatory Visit (INDEPENDENT_AMBULATORY_CARE_PROVIDER_SITE_OTHER): Payer: PPO | Admitting: Internal Medicine

## 2016-06-13 ENCOUNTER — Encounter: Payer: Self-pay | Admitting: Internal Medicine

## 2016-06-13 VITALS — BP 140/84 | HR 62 | Ht 67.0 in | Wt 136.8 lb

## 2016-06-13 DIAGNOSIS — Z950 Presence of cardiac pacemaker: Secondary | ICD-10-CM

## 2016-06-13 DIAGNOSIS — I495 Sick sinus syndrome: Secondary | ICD-10-CM

## 2016-06-13 DIAGNOSIS — I480 Paroxysmal atrial fibrillation: Secondary | ICD-10-CM | POA: Diagnosis not present

## 2016-06-13 MED ORDER — ASPIRIN EC 325 MG PO TBEC: 325.0000 mg | DELAYED_RELEASE_TABLET | Freq: Every day | ORAL | 0 refills | Status: DC

## 2016-06-13 NOTE — Patient Instructions (Signed)
Medication Instructions:  Your physician recommends that you continue on your current medications as directed. Please refer to the Current Medication list given to you today.   Labwork: None ordered   Testing/Procedures: None ordered   Follow-Up: Your physician recommends that you schedule a follow-up appointment with Dr [name] as schedeled   Any Other Special Instructions Will Be Listed Below (If Applicable).     If you need a refill on your cardiac medications before your next appointment, please call your pharmacy.

## 2016-06-14 ENCOUNTER — Encounter: Payer: Self-pay | Admitting: Internal Medicine

## 2016-06-14 NOTE — Progress Notes (Signed)
   PCP: Abigail MiyamotoPERRY,LAWRENCE EDWARD, MD  Olene CravenMary T [name] is a 75 y.o. female who presents today for electrophysiology followup.  She has remained in sinus since her recent pacemaker implant.  Since last being seen in our clinic, the patient reports doing very well.  Today, she denies symptoms of palpitations, chest pain, shortness of breath,  lower extremity edema, dizziness, presyncope, or syncope.  The patient is otherwise without complaint today.   Past Medical History:  Diagnosis Date  . Atrial fibrillation (HCC)   . HTN (hypertension)    11/20/12 -ECHO  EF 55-60%  . PVC's (premature ventricular contractions)   . Right bundle branch block   . Tachy-brady syndrome [facility](HCC)    Past Surgical History:  Procedure Laterality Date  . EP IMPLANTABLE DEVICE N/A 06/03/2016   Procedure: Pacemaker Implant;  Surgeon: Will Jorja LoaMartin Camnitz, MD;  Location: [facility] INVASIVE CV LAB;  Service: Cardiovascular;  Laterality: N/A;  . TONSILLECTOMY      ROS- all systems are reviewed and negative except as per HPI above  Current Outpatient Prescriptions  Medication Sig Dispense Refill  . atenolol (TENORMIN) 100 MG tablet Take 1 tablet (100 mg total) by mouth 2 (two) times daily. 60 tablet 6  . B Complex Vitamins (VITAMIN-B COMPLEX) TABS Take 1 tablet by mouth daily as needed (takes occassionally for vitamin supplementation).     . Coenzyme Q10 (COQ10 PO) Take 1 tablet by mouth daily as needed (takes occassionally for vitamin supplementation).     Marland Kitchen. diltiazem (CARDIZEM CD) 240 MG 24 hr capsule Take 1 capsule (240 mg total) by mouth daily. 30 capsule 6  . diltiazem (CARDIZEM) 30 MG tablet Take 30 mg by mouth as directed. Take as needed for heart rate elevation    . lisinopril (PRINIVIL,ZESTRIL) 40 MG tablet TAKE ONE TABLET BY MOUTH ONCE DAILY 30 tablet 0  . LORazepam (ATIVAN) 1 MG tablet Take 1 mg by mouth 3 (three) times daily as needed for anxiety.     Marland Kitchen. aspirin EC 325 MG tablet Take 1 tablet (325 mg total) by mouth  daily. 30 tablet 0   No current facility-administered medications for this visit.     Physical Exam: Vitals:   06/13/16 1111  BP: 140/84  Pulse: 62  SpO2: 98%  Weight: 136 lb 12.8 oz (62.1 kg)  Height: 5\' 7"  (1.702 m)    GEN- The patient is well appearing, alert and oriented x 3 today.   Head- normocephalic, atraumatic Eyes-  Sclera clear, conjunctiva pink Ears- hearing intact Oropharynx- clear Lungs- Clear to ausculation bilaterally, normal work of breathing Chest- pacemaker pocket is well healed Heart- Regular rate and rhythm, no murmurs, rubs or gallops, PMI not laterally displaced GI- soft, NT, ND, + BS Extremities- no clubbing, cyanosis, or edema  Pacemaker interrogation- reviewed in detail today,  See PACEART report  Assessment and Plan:  1. Post termination pauses Normal pacemaker function See Pace Art report No changes today  2. paroxysmal atrial fibrillation Given recent PPM, will not proceed with ablation until leads settle into place.  She does not wish to start AAD therapy today.  She is on very high doses of Atenolol and diltiazem but does not wish to decrease these.  She is clear in her decision to not take anticoagulation at this time.  Return to see me in 3 months

## 2016-06-16 ENCOUNTER — Ambulatory Visit: Payer: PPO

## 2016-06-17 ENCOUNTER — Ambulatory Visit (HOSPITAL_COMMUNITY): Payer: PPO

## 2016-06-17 ENCOUNTER — Other Ambulatory Visit (HOSPITAL_COMMUNITY): Payer: PPO | Admitting: Nurse Practitioner

## 2016-06-30 ENCOUNTER — Encounter (HOSPITAL_COMMUNITY): Payer: Self-pay

## 2016-06-30 ENCOUNTER — Ambulatory Visit (HOSPITAL_COMMUNITY): Admit: 2016-06-30 | Payer: PPO | Admitting: Internal Medicine

## 2016-06-30 SURGERY — ATRIAL FIBRILLATION ABLATION
Anesthesia: Monitor Anesthesia Care

## 2016-07-05 ENCOUNTER — Ambulatory Visit (INDEPENDENT_AMBULATORY_CARE_PROVIDER_SITE_OTHER): Payer: PPO | Admitting: Cardiovascular Disease

## 2016-07-05 ENCOUNTER — Encounter: Payer: Self-pay | Admitting: Cardiovascular Disease

## 2016-07-05 VITALS — BP 162/83 | HR 72 | Ht 67.0 in | Wt 138.8 lb

## 2016-07-05 DIAGNOSIS — I495 Sick sinus syndrome: Secondary | ICD-10-CM

## 2016-07-05 DIAGNOSIS — R400 Somnolence: Secondary | ICD-10-CM | POA: Diagnosis not present

## 2016-07-05 DIAGNOSIS — I4891 Unspecified atrial fibrillation: Secondary | ICD-10-CM

## 2016-07-05 DIAGNOSIS — I4510 Unspecified right bundle-branch block: Secondary | ICD-10-CM

## 2016-07-05 DIAGNOSIS — I493 Ventricular premature depolarization: Secondary | ICD-10-CM

## 2016-07-05 DIAGNOSIS — I10 Essential (primary) hypertension: Secondary | ICD-10-CM | POA: Diagnosis not present

## 2016-07-05 NOTE — Patient Instructions (Signed)
Your physician recommends that you return for lab work in: FASTING.  Your physician recommends that you schedule a follow-up appointment in: 3 -4 MONTHS

## 2016-07-07 NOTE — Progress Notes (Signed)
Patient ID: Emily [name], female   DOB: [birth year], 75 y.o.   MRN: [id number]     HPI: Emily [name], is a 75 y.o. female who presents to the office to for a 6 month cardiology followup evaluation.  Emily [name] has a history of PVCs as well as paroxysmal atrial fibrillation. She has documented right bundle branch block. I saw her for initial cardiology evaluation 10/23/2012. She has a history of significant hypertension, which in the past had been labile.  An echo Doppler study showed normal systolic function with grade 2 diastolic dysfunction. She did have mild MR, moderate TR, mild to moderate pulmonary hypertension, as well as mild aortic sclerosis with mild aortic insufficiency.  Her blood pressure medications have been titrated to now include atenolol 100 mg in the morning and 75 mg in the evening , lisinopril 40 mg daily , and she has available to take diltiazem 30 mg on a when necessary bass.  Remotely, prior to titration of her beta blocker she had to take the diltiazem much more frequently.  She now rarely has to take this and over the past year had only taken it on one occasion.  She underwent an echo Doppler study on 01/01/2015.  This showed an ejection fraction of 55-60%.  There was mild PA pressure increased at 34 mm.  There was mild aortic insufficiency.  She developed episodes of recurrent palpitations on 08/03/2015.  She had a prescription for Cardizem 30 mg to take as needed.  She apparently took 4 Cardizem 30 mg tablets between 10:30 PM and 1 AM and her heart rate was slower but still rhythm.  At 5 AM she developed some orthostatic dizziness but noted her heart rate was still elevated and took additional Cardizem.  She also he presented to the emergency room where she was hypotensive and bradycardic with heart rates in the 30s and 40s.  She was seen in the ER for cardiology evaluation and she had converted back to sinus rhythm.  Anticoagulation was again discussed, but she had  refused this in the past and also has refused this.  She had been maintained on atenolol 100 mg in the morning and 75 mg in the evening, lisinopril 40 mg and aspirin.    When I last saw her earlier in 2017, she was hypertensive with a blood pressure of 162/80.  I suggested that she institute Cardizem CD 120 mg at bedtime and to reduce potnetial  bradycardia to reduce her atenolol from 100 mg in the morning to 75 mg and from 75 mg at night to 50 mg.  Apparently, she never did this.  She thought thought that by doing TyChi for 3 months her blood pressure would do well.  She would not require additional treatment.   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  She saw [name] in follow-up and has been maintaining sinus rhythm since.  She has consistently refused anticoagulation.  Ultimately, she will be potentially undergoing AF ablation by [name] and prior to that study.  She will undergo a cardiac CT.  On further questioning, she typically goes to bed between 10 PM and midnight and wakes up at 6 AM.  She usually has nocturia and wakes up 1-2 times per night.  She presents for evaluation.  Past Medical History:  Diagnosis Date  . Atrial fibrillation (HCC)   . HTN (hypertension)  11/20/12 -ECHO  EF 55-60%  . PVC's (premature ventricular contractions)   . Right bundle branch block   . Tachy-brady syndrome [facility](HCC)     Past Surgical History:  Procedure Laterality Date  . EP IMPLANTABLE DEVICE N/A 06/03/2016   MDT Advisa MRI compatible pacemaker implanted by Dr Elberta Fortisamnitz for post termination pauses with AF.  . TONSILLECTOMY      No Known Allergies  Current Outpatient Prescriptions  Medication Sig Dispense Refill  . aspirin EC 325 MG tablet Take 1 tablet (325 mg total) by mouth daily. 30 tablet 0  . atenolol (TENORMIN) 100 MG tablet Take 1 tablet (100 mg total) by mouth 2 (two) times  daily. 60 tablet 6  . B Complex Vitamins (VITAMIN-B COMPLEX) TABS Take 1 tablet by mouth daily as needed (takes occassionally for vitamin supplementation).     . Coenzyme Q10 (COQ10 PO) Take 1 tablet by mouth daily as needed (takes occassionally for vitamin supplementation).     Marland Kitchen. diltiazem (CARDIZEM CD) 240 MG 24 hr capsule Take 1 capsule (240 mg total) by mouth daily. 30 capsule 6  . diltiazem (CARDIZEM) 30 MG tablet Take 30 mg by mouth as directed. Take as needed for heart rate elevation    . lisinopril (PRINIVIL,ZESTRIL) 40 MG tablet TAKE ONE TABLET BY MOUTH ONCE DAILY 30 tablet 0  . LORazepam (ATIVAN) 1 MG tablet Take 1 mg by mouth 3 (three) times daily as needed for anxiety.      No current facility-administered medications for this visit.     Socially she is married has 3 children, 9 grandchildren and 2 great-grandchildren. She has been active over the summer months and typically progress in the backyard and tobacco use. She does drink occasional alcohol.  ROS General: Negative; No fevers, chills, or night sweats;  HEENT: Negative; No changes in vision or hearing, sinus congestion, difficulty swallowing Pulmonary: Negative; No cough, wheezing, shortness of breath, hemoptysis Cardiovascular: Negative; No chest pain, presyncope, syncope, palpitations GI: Negative; No nausea, vomiting, diarrhea, or abdominal pain GU: Negative; No dysuria, hematuria, or difficulty voiding Musculoskeletal: Negative; no myalgias, joint pain, or weakness Hematologic/Oncology: Negative; no easy bruising, bleeding Endocrine: Negative; no heat/cold intolerance; no diabetes Neuro: Negative; no changes in balance, headaches Skin: Negative; No rashes or skin lesions Psychiatric: Positive for occasional anxiety. Sleep: Negative; No snoring, daytime sleepiness, hypersomnolence, bruxism, restless legs, hypnogognic hallucinations, no cataplexy Other comprehensive 14 point system review is negative.  PE BP (!)  162/83   Pulse 72   Ht 5\' 7"  (1.702 m)   Wt 138 lb 12.8 oz (63 kg)   BMI 21.74 kg/m    Repeat blood pressure taken by me was 160/84  Wt Readings from Last 3 Encounters:  07/05/16 138 lb 12.8 oz (63 kg)  06/13/16 136 lb 12.8 oz (62.1 kg)  06/04/16 137 lb 3.2 oz (62.2 kg)   General: Alert, oriented, no distress.  Skin: normal turgor, no rashes HEENT: Normocephalic, atraumatic. Pupils round and reactive; sclera anicteric;no lid lag.  Nose without nasal septal hypertrophy Mouth/Parynx benign; Mallinpatti scale 2 Neck: No JVD, no carotid bruits with normal upstroke Chest: No tenderness to palpation Lungs: clear to ausculatation and percussion; no wheezing or rales Heart: RRR, s1 s2 normal 1/6 SEM; .  No S3 gallop.  Soft S4.  No rubs thrills or heaves Abdomen: soft, nontender; no hepatosplenomehaly, BS+; abdominal aorta nontender and not dilated by palpation. Back: No CVA tenderness Pulses 2+ Extremities: no clubbing cyanosis or edema, Homan's sign negative  Neurologic: grossly nonfocal Psychological: Normal affect and mood  ECG (independently read by me): Atrilly paced rhythm at 72 bpm.  Prolonged AV conduction with PR 244 ms.  Right bundle-branch block.  June 2017 ECG (independently read by me): Sinus bradycardia 59 bpm.  No ectopy.  Right bundle branch block with repolarization changes  April 2017 ECG (independently read by me): Normal sinus rhythm at 64 bpm.  Right bundle-branch block with repolarization changes.  QTc interval 462 ms.  June 2016 ECG (independently read by me): Normal sinus rhythm at 60 bpm.  Right bundle-branch block with repolarization changes.  No ectopy.  Normal intervals.  February 2016 ECG (independently read by me):  Sinus bradycardia 56 bpm right bundle branch block with repolarization changes.  Normal intervals.  January 2015 ECG (independently read by me): Sinus rhythm at 56 beats per minute. Right bundle branch block with repolarization changes. No  ectopy. Normal intervals.  Review of prior ECG from 03/12/2013: Sinus rhythm at 55 beats per minute with right bundle branch block and repolarization changes. One isolated premature atrial contraction.  LABS: BMP Latest Ref Rng & Units 06/03/2016 06/01/2016 04/14/2016  Glucose 65 - 99 mg/dL 578(I122(H) 696(E109(H) 952(W149(H)  BUN 6 - 20 mg/dL 41(L21(H) 11 18  Creatinine 0.44 - 1.00 mg/dL 2.44(W1.05(H) 1.020.93 7.250.98  Sodium 135 - 145 mmol/L 140 141 141  Potassium 3.5 - 5.1 mmol/L 4.1 3.9 3.5  Chloride 101 - 111 mmol/L 106 105 111  CO2 22 - 32 mmol/L 23 28 24   Calcium 8.9 - 10.3 mg/dL 9.5 9.5 9.3   No flowsheet data found. CBC Latest Ref Rng & Units 06/03/2016 06/01/2016 04/14/2016  WBC 4.0 - 10.5 K/uL 7.3 7.6 8.2  Hemoglobin 12.0 - 15.0 g/dL 36.614.1 44.014.2 34.713.3  Hematocrit 36.0 - 46.0 % 43.2 43.5 40.7  Platelets 150 - 400 K/uL 191 224 190   Lab Results  Component Value Date   MCV 93.9 06/03/2016   MCV 93.8 06/01/2016   MCV 93.8 04/14/2016   Lab Results  Component Value Date   TSH 1.815 06/03/2016    Lipid Panel  No results found for: CHOL, TRIG, HDL, CHOLHDL, VLDL, LDLCALC, LDLDIRECT  RADIOLOGY: No results found.  ------------------------------------------------------------------- ECHO Study Conclusions:  01/01/2015  - Left ventricle: The cavity size was normal. Systolic function was normal. The estimated ejection fraction was in the range of 55% to 60%. - Aortic valve: There was mild regurgitation. - Pulmonary arteries: PA peak pressure: 34 mm Hg (S).   ASSESSMENT AND PLAN: Emily [name] is a 75 year-old female who has a history of PVCs, hypertension, RBBB as well as paroxysmal atrial fibrillation.  She had been maintaining sinus rhythm and periodically has taken Cardizem 30 mg if she had noticed recurrent palpitations.  She has paroxysmal atrial fibrillation with bradycardia tachycardia syndrome and since I last saw her, she did undergo permanent pacemaker implantation.  Ultimately, the plan is for her  to undergo atrial fibrillation ablation.  She has refused antiarrhythmic therapy.  She's refused anticoagulation, but continues to be on rate control medications consisting of a 10 a low 100 mg twice a day, diltiazem 240 mg daily and takes when necessary diltiazem 30 mg if she developed recurrent palpitations.  She also is on lisinopril 40 mg for hypertension.  Blood pressure today was elevated.  I discussed with her my concern for possible sleep apnea which may be contributory to her atrial fibrillation.  I discussed undergoing a sleep study but apparently she declined getting this  scheduled.  I will check fasting lab work.  She ultimately will be undergoing a cardiac CT prior to potential ablation.  I will see her in 3-4 months for reevaluation.  Time spent: 25 minutes  Lennette Bihari, MD, [facility]  07/07/2016 7:22 PM

## 2016-07-12 ENCOUNTER — Telehealth (HOSPITAL_COMMUNITY): Payer: Self-pay | Admitting: *Deleted

## 2016-07-12 NOTE — Telephone Encounter (Signed)
Patient called stating her "heart is acting up" for the first time since having her pacemaker placed. States she doesn't know how fast its going but she can feel it running away. Patient has taken her morning medications about 2 hours ago. Had pt take BP/HR while on phone. BP 150/68 HR 104. Instructed pt take cardizem 30mg   - pt was hesitate due to making her feel bad in the past with taking. Instructed pt she can take 1/2 tablet of the cardizem and repeat in few hours if HR is still fast. Instructed pt to let us know if episodes become more frequent. Pt will call back if further questions

## 2016-07-18 ENCOUNTER — Encounter (HOSPITAL_COMMUNITY): Payer: PPO | Admitting: Nurse Practitioner

## 2016-08-01 ENCOUNTER — Ambulatory Visit (HOSPITAL_COMMUNITY): Payer: PPO | Admitting: Nurse Practitioner

## 2016-08-17 ENCOUNTER — Encounter: Payer: Self-pay | Admitting: *Deleted

## 2016-08-26 LAB — CUP PACEART INCLINIC DEVICE CHECK
Brady Statistic AP VP Percent: 0.03 %
Brady Statistic AP VS Percent: 67.03 %
Brady Statistic AS VP Percent: 0.01 %
Brady Statistic RA Percent Paced: 67.04 %
Brady Statistic RV Percent Paced: 0.04 %
Date Time Interrogation Session: 20171016154329
Implantable Lead Implant Date: 20171006
Implantable Lead Location: 753859
Implantable Lead Location: 753860
Implantable Lead Model: 5076
Lead Channel Impedance Value: 437 Ohm
Lead Channel Impedance Value: 532 Ohm
Lead Channel Pacing Threshold Amplitude: 0.75 V
Lead Channel Pacing Threshold Pulse Width: 0.4 ms
Lead Channel Sensing Intrinsic Amplitude: 3.375 mV
Lead Channel Setting Pacing Amplitude: 3.5 V
Lead Channel Setting Sensing Sensitivity: 0.9 mV
MDC IDC LEAD IMPLANT DT: 20171006
MDC IDC MSMT BATTERY VOLTAGE: 3.12 V
MDC IDC MSMT LEADCHNL RA IMPEDANCE VALUE: 323 Ohm
MDC IDC MSMT LEADCHNL RA IMPEDANCE VALUE: 399 Ohm
MDC IDC MSMT LEADCHNL RV PACING THRESHOLD AMPLITUDE: 0.75 V
MDC IDC MSMT LEADCHNL RV PACING THRESHOLD PULSEWIDTH: 0.4 ms
MDC IDC MSMT LEADCHNL RV SENSING INTR AMPL: 7.25 mV
MDC IDC PG IMPLANT DT: 20171006
MDC IDC SET LEADCHNL RA PACING AMPLITUDE: 3.5 V
MDC IDC SET LEADCHNL RV PACING PULSEWIDTH: 0.4 ms
MDC IDC STAT BRADY AS VS PERCENT: 32.93 %

## 2016-09-07 ENCOUNTER — Ambulatory Visit (INDEPENDENT_AMBULATORY_CARE_PROVIDER_SITE_OTHER): Payer: Medicare HMO | Admitting: Internal Medicine

## 2016-09-07 ENCOUNTER — Encounter: Payer: Self-pay | Admitting: Internal Medicine

## 2016-09-07 VITALS — BP 146/90 | HR 84 | Ht 67.0 in | Wt 141.6 lb

## 2016-09-07 DIAGNOSIS — I480 Paroxysmal atrial fibrillation: Secondary | ICD-10-CM | POA: Diagnosis not present

## 2016-09-07 DIAGNOSIS — I10 Essential (primary) hypertension: Secondary | ICD-10-CM

## 2016-09-07 DIAGNOSIS — I495 Sick sinus syndrome: Secondary | ICD-10-CM | POA: Diagnosis not present

## 2016-09-07 NOTE — Patient Instructions (Signed)
Medication Instructions:  Your physician recommends that you continue on your current medications as directed. Please refer to the Current Medication list given to you today.   Labwork: None ordered   Testing/Procedures: None ordered   Follow-Up: Remote monitoring is used to monitor your Pacemaker  from home. This monitoring reduces the number of office visits required to check your device to one time per year. It allows us to keep an eye on the functioning of your device to ensure it is working properly. You are scheduled for a device check from home on 12/07/16. You may send your transmission at any time that day. If you have a wireless device, the transmission will be sent automatically. After your physician reviews your transmission, you will receive a postcard with your next transmission date.  Your physician recommends that you schedule a follow-up appointment in: 4 months with Rudi Cocoonna Carroll, NP and 12 months with Dr [name]     Any Other Special Instructions Will Be Listed Below (If Applicable).     If you need a refill on your cardiac medications before your next appointment, please call your pharmacy.

## 2016-09-07 NOTE — Progress Notes (Signed)
   PCP: Abigail MiyamotoPERRY,LAWRENCE EDWARD, MD  Olene CravenMary T [name] is a 76 y.o. female who presents today for electrophysiology followup.  She has remained in sinus since her recent pacemaker implant with only rare palpitations.  Presyncope has resolved.  She is pleased with current health state.   Today, she denies symptoms of palpitations, chest pain, shortness of breath,  lower extremity edema, dizziness, presyncope, or syncope.  The patient is otherwise without complaint today.   Past Medical History:  Diagnosis Date  . Atrial fibrillation (HCC)   . HTN (hypertension)    11/20/12 -ECHO  EF 55-60%  . PVC's (premature ventricular contractions)   . Right bundle branch block   . Tachy-brady syndrome [facility](HCC)    Past Surgical History:  Procedure Laterality Date  . EP IMPLANTABLE DEVICE N/A 06/03/2016   MDT Advisa MRI compatible pacemaker implanted by Dr Elberta Fortisamnitz for post termination pauses with AF.  . TONSILLECTOMY      ROS- all systems are reviewed and negative except as per HPI above  Current Outpatient Prescriptions  Medication Sig Dispense Refill  . aspirin EC 325 MG tablet Take 1 tablet (325 mg total) by mouth daily. 30 tablet 0  . atenolol (TENORMIN) 100 MG tablet Take 1 tablet (100 mg total) by mouth 2 (two) times daily. 60 tablet 6  . B Complex Vitamins (VITAMIN-B COMPLEX) TABS Take 1 tablet by mouth daily as needed (takes occassionally for vitamin supplementation).     . Coenzyme Q10 (COQ10 PO) Take 1 tablet by mouth daily as needed (takes occassionally for vitamin supplementation).     Marland Kitchen. diltiazem (CARDIZEM CD) 240 MG 24 hr capsule Take 1 capsule (240 mg total) by mouth daily. 30 capsule 6  . diltiazem (CARDIZEM) 30 MG tablet Take 30 mg by mouth as directed. Take as needed for heart rate elevation    . lisinopril (PRINIVIL,ZESTRIL) 40 MG tablet TAKE ONE TABLET BY MOUTH ONCE DAILY 30 tablet 0  . LORazepam (ATIVAN) 1 MG tablet Take 1 mg by mouth 3 (three) times daily as needed for anxiety.       No current facility-administered medications for this visit.     Physical Exam: Vitals:   09/07/16 0951  BP: (!) 146/90  Pulse: 84  Weight: 141 lb 9.6 oz (64.2 kg)  Height: 5\' 7"  (1.702 m)    GEN- The patient is well appearing, alert and oriented x 3 today.   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 paroxysmal atrial fibrillation As her afib is well controlled currently (0.8%), we have decided together to not proceed with afib ablation at this time.  Should she begin having increased episodes of afib then we will reconsider.  Chads2vasc score is at least 3.  She declines anticoagulation again today. Prn cardizem  3. HTN Stable No change required today  Follow-up in AF clinic in 4 months carelink Return to see me in 1 year unless problems arise in the interim.  Hillis RangeJames Codey Burling MD, [facility]ACC 09/07/2016 10:18 AM

## 2016-09-19 LAB — CUP PACEART INCLINIC DEVICE CHECK
Battery Remaining Longevity: 126 mo
Battery Voltage: 3.05 V
Brady Statistic AP VP Percent: 0.04 %
Brady Statistic AP VS Percent: 88.18 %
Brady Statistic AS VP Percent: 0.32 %
Brady Statistic AS VS Percent: 11.46 %
Brady Statistic RA Percent Paced: 87.83 %
Brady Statistic RV Percent Paced: 0.37 %
Date Time Interrogation Session: 20180110160056
Implantable Lead Implant Date: 20171006
Implantable Lead Implant Date: 20171006
Implantable Lead Location: 753859
Implantable Lead Location: 753860
Implantable Lead Model: 5076
Implantable Lead Model: 5076
Implantable Pulse Generator Implant Date: 20171006
Lead Channel Impedance Value: 342 Ohm
Lead Channel Impedance Value: 456 Ohm
Lead Channel Impedance Value: 513 Ohm
Lead Channel Impedance Value: 608 Ohm
Lead Channel Pacing Threshold Amplitude: 0.75 V
Lead Channel Pacing Threshold Amplitude: 0.75 V
Lead Channel Pacing Threshold Pulse Width: 0.4 ms
Lead Channel Pacing Threshold Pulse Width: 0.4 ms
Lead Channel Sensing Intrinsic Amplitude: 3 mV
Lead Channel Sensing Intrinsic Amplitude: 9.25 mV
Lead Channel Setting Pacing Amplitude: 1.75 V
Lead Channel Setting Pacing Amplitude: 2 V
Lead Channel Setting Pacing Pulse Width: 0.4 ms
Lead Channel Setting Sensing Sensitivity: 0.9 mV

## 2016-11-30 ENCOUNTER — Telehealth: Payer: Self-pay | Admitting: Internal Medicine

## 2016-11-30 NOTE — Telephone Encounter (Signed)
Patient received recall letter in mail to make appt with [name] but states that at her last office visit, she was told that she could back in year. Patient would like to verify when she needs to come in for appointment. Thanks.

## 2016-11-30 NOTE — Telephone Encounter (Signed)
Informed patient that according to her last office visit, she is suppose to see Rudi Coco NP in 4 months (May) and see [name] in a year (January 2019). Patient has appointment in May with Rudi Coco NP and patient has recall letter for [name] for f/u in January. Patient verbalized understanding.

## 2016-12-07 ENCOUNTER — Ambulatory Visit (INDEPENDENT_AMBULATORY_CARE_PROVIDER_SITE_OTHER): Payer: Medicare HMO | Admitting: *Deleted

## 2016-12-07 DIAGNOSIS — I495 Sick sinus syndrome: Secondary | ICD-10-CM | POA: Diagnosis not present

## 2016-12-07 NOTE — Progress Notes (Signed)
Remote pacemaker transmission.   

## 2016-12-08 ENCOUNTER — Encounter: Payer: Self-pay | Admitting: Cardiology

## 2016-12-09 LAB — CUP PACEART REMOTE DEVICE CHECK
Battery Voltage: 3.03 V
Brady Statistic AP VP Percent: 0.05 %
Brady Statistic AS VP Percent: 0.21 %
Brady Statistic RA Percent Paced: 90.96 %
Brady Statistic RV Percent Paced: 0.27 %
Implantable Lead Implant Date: 20171006
Implantable Lead Location: 753860
Implantable Lead Model: 5076
Implantable Lead Model: 5076
Implantable Pulse Generator Implant Date: 20171006
Lead Channel Impedance Value: 342 Ohm
Lead Channel Impedance Value: 456 Ohm
Lead Channel Impedance Value: 513 Ohm
Lead Channel Impedance Value: 608 Ohm
Lead Channel Pacing Threshold Amplitude: 0.875 V
Lead Channel Pacing Threshold Pulse Width: 0.4 ms
Lead Channel Pacing Threshold Pulse Width: 0.4 ms
Lead Channel Sensing Intrinsic Amplitude: 6.625 mV
Lead Channel Setting Pacing Amplitude: 1.75 V
Lead Channel Setting Pacing Pulse Width: 0.4 ms
Lead Channel Setting Sensing Sensitivity: 0.9 mV
MDC IDC LEAD IMPLANT DT: 20171006
MDC IDC LEAD LOCATION: 753859
MDC IDC MSMT BATTERY REMAINING LONGEVITY: 111 mo
MDC IDC MSMT LEADCHNL RA SENSING INTR AMPL: 2.625 mV
MDC IDC MSMT LEADCHNL RA SENSING INTR AMPL: 2.625 mV
MDC IDC MSMT LEADCHNL RV PACING THRESHOLD AMPLITUDE: 1 V
MDC IDC MSMT LEADCHNL RV SENSING INTR AMPL: 6.625 mV
MDC IDC SESS DTM: 20180411132214
MDC IDC SET LEADCHNL RV PACING AMPLITUDE: 2 V
MDC IDC STAT BRADY AP VS PERCENT: 91.44 %
MDC IDC STAT BRADY AS VS PERCENT: 8.29 %

## 2017-01-10 ENCOUNTER — Encounter (HOSPITAL_COMMUNITY): Payer: Self-pay | Admitting: Nurse Practitioner

## 2017-01-10 ENCOUNTER — Ambulatory Visit (HOSPITAL_COMMUNITY)
Admission: RE | Admit: 2017-01-10 | Discharge: 2017-01-10 | Disposition: A | Discharge status: Home / Self Care | Payer: Medicare HMO | Source: Ambulatory Visit | Attending: Nurse Practitioner | Admitting: Nurse Practitioner

## 2017-01-10 VITALS — BP 134/86 | HR 65 | Ht 67.0 in | Wt 145.0 lb

## 2017-01-10 DIAGNOSIS — Z87891 Personal history of nicotine dependence: Secondary | ICD-10-CM | POA: Insufficient documentation

## 2017-01-10 DIAGNOSIS — I480 Paroxysmal atrial fibrillation: Secondary | ICD-10-CM

## 2017-01-10 DIAGNOSIS — I493 Ventricular premature depolarization: Secondary | ICD-10-CM | POA: Diagnosis not present

## 2017-01-10 DIAGNOSIS — Z7982 Long term (current) use of aspirin: Secondary | ICD-10-CM | POA: Insufficient documentation

## 2017-01-10 DIAGNOSIS — I4891 Unspecified atrial fibrillation: Secondary | ICD-10-CM | POA: Insufficient documentation

## 2017-01-10 DIAGNOSIS — I4439 Other atrioventricular block: Secondary | ICD-10-CM | POA: Insufficient documentation

## 2017-01-10 DIAGNOSIS — I4510 Unspecified right bundle-branch block: Secondary | ICD-10-CM | POA: Insufficient documentation

## 2017-01-10 DIAGNOSIS — I10 Essential (primary) hypertension: Secondary | ICD-10-CM | POA: Diagnosis not present

## 2017-01-10 DIAGNOSIS — Z950 Presence of cardiac pacemaker: Secondary | ICD-10-CM | POA: Diagnosis not present

## 2017-01-10 NOTE — Progress Notes (Signed)
Primary Care Physician: Abigail MiyamotoPerry, Lawrence Edward, MD Referring Physician: Dr. Leota SauersAllred   Emily [name] is a 76 y.o. female with a h/o afib with post termination pauses/syncope and has a PPM implanted 05/2016. No further syncope. She is in the afib clinic for evaluation. She has not noted any significant afib.Remote checks show a burden around 1%. She still defers anticoagulation for chadsvasc score of 3. She is very active.  Today, she denies symptoms of palpitations, chest pain, shortness of breath, orthopnea, PND, lower extremity edema, dizziness, presyncope, syncope, or neurologic sequela. The patient is tolerating medications without difficulties and is otherwise without complaint today.   Past Medical History:  Diagnosis Date  . Atrial fibrillation (HCC)   . HTN (hypertension)    11/20/12 -ECHO  EF 55-60%  . PVC's (premature ventricular contractions)   . Right bundle branch block   . Tachy-brady syndrome [facility](HCC)    Past Surgical History:  Procedure Laterality Date  . EP IMPLANTABLE DEVICE N/A 06/03/2016   MDT Advisa MRI compatible pacemaker implanted by Dr Elberta Fortisamnitz for post termination pauses with AF.  . TONSILLECTOMY      Current Outpatient Prescriptions  Medication Sig Dispense Refill  . aspirin EC 325 MG tablet Take 1 tablet (325 mg total) by mouth daily. 30 tablet 0  . atenolol (TENORMIN) 100 MG tablet Take 1 tablet (100 mg total) by mouth 2 (two) times daily. 60 tablet 6  . B Complex Vitamins (VITAMIN-B COMPLEX) TABS Take 1 tablet by mouth daily as needed (takes occassionally for vitamin supplementation).     . Coenzyme Q10 (COQ10 PO) Take 1 tablet by mouth daily as needed (takes occassionally for vitamin supplementation).     Marland Kitchen. diltiazem (CARDIZEM CD) 240 MG 24 hr capsule Take 1 capsule (240 mg total) by mouth daily. 30 capsule 6  . diltiazem (CARDIZEM) 30 MG tablet Take 30 mg by mouth as directed. Take as needed for heart rate elevation    . lisinopril (PRINIVIL,ZESTRIL) 40  MG tablet TAKE ONE TABLET BY MOUTH ONCE DAILY 30 tablet 0  . LORazepam (ATIVAN) 1 MG tablet Take 1 mg by mouth 3 (three) times daily as needed for anxiety.      No current facility-administered medications for this encounter.     No Known Allergies  Social History   Social History  . Marital status: Married    Spouse name: N/A  . Number of children: 3  . Years of education: N/A   Occupational History  . [employer]ra[employer]    Social History Main Topics  . Smoking status: Former Smoker    Quit date: 09/01/1976  . Smokeless tobacco: Never Used  . Alcohol use 0.0 oz/week     Comment: occassional  . Drug use: No  . Sexual activity: Not on file   Other Topics Concern  . Not on file   Social History Narrative   [place] in [place]  Cares for her husband who is disabled.   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 Aneurysm Mother   . Heart disease Father     ROS- All systems are reviewed and negative except as per the HPI above  Physical Exam: Vitals:   01/10/17 1058  BP: 134/86  Pulse: 65  Weight: 145 lb (65.8 kg)  Height: 5\' 7"  (1.702 m)   Wt Readings from Last 3 Encounters:  01/10/17 145 lb (65.8 kg)  09/07/16 141 lb 9.6  oz (64.2 kg)  07/05/16 138 lb 12.8 oz (63 kg)    Labs: Lab Results  Component Value Date   NA 140 06/03/2016   K 4.1 06/03/2016   CL 106 06/03/2016   CO2 23 06/03/2016   GLUCOSE 122 (H) 06/03/2016   BUN 21 (H) 06/03/2016   CREATININE 1.05 (H) 06/03/2016   CALCIUM 9.5 06/03/2016   MG 1.9 06/03/2016   No results found for: INR No results found for: CHOL, HDL, LDLCALC, TRIG   GEN- The patient is well appearing, alert and oriented x 3 today.   Head- normocephalic, atraumatic Eyes-  Sclera clear, conjunctiva pink Ears- hearing intact Oropharynx- clear Neck- supple, no JVP Lymph- no cervical lymphadenopathy Lungs- Clear to ausculation bilaterally, normal work of breathing Heart- Regular  rate and rhythm, no murmurs, rubs or gallops, PMI not laterally displaced GI- soft, NT, ND, + BS Extremities- no clubbing, cyanosis, or edema MS- no significant deformity or atrophy Skin- no rash or lesion Psych- euthymic mood, full affect Neuro- strength and sensation are intact  EKG-atrial paced rhythm at 65 bpm, pr int 214 ms, qrs int 126 ms, qtc 424 ms Epic records reviewed Remote device checks   Assessment and Plan: 1. Afib Very low burden  Continue atenolol/ diltiazem Defers anticoagulation with chadsvasc score of 3, on asa 325 mg a day Encouraged to continue her regular exercise and outdoor activities   f/u with [name] in 6 months afib clinic as needed  Lupita Leash C[facility] [address] [place], [place] [address] [phone]

## 2017-03-08 ENCOUNTER — Ambulatory Visit (INDEPENDENT_AMBULATORY_CARE_PROVIDER_SITE_OTHER): Payer: Medicare HMO | Admitting: *Deleted

## 2017-03-08 DIAGNOSIS — I495 Sick sinus syndrome: Secondary | ICD-10-CM | POA: Diagnosis not present

## 2017-03-08 NOTE — Progress Notes (Signed)
Remote pacemaker transmission.   

## 2017-03-14 ENCOUNTER — Encounter: Payer: Self-pay | Admitting: Cardiology

## 2017-03-22 LAB — CUP PACEART REMOTE DEVICE CHECK
Battery Voltage: 3.02 V
Brady Statistic AP VP Percent: 0.04 %
Brady Statistic AS VP Percent: 0.05 %
Brady Statistic AS VS Percent: 11.25 %
Brady Statistic RV Percent Paced: 0.09 %
Implantable Lead Implant Date: 20171006
Implantable Lead Location: 753859
Implantable Lead Model: 5076
Implantable Pulse Generator Implant Date: 20171006
Lead Channel Impedance Value: 323 Ohm
Lead Channel Impedance Value: 456 Ohm
Lead Channel Impedance Value: 456 Ohm
Lead Channel Impedance Value: 608 Ohm
Lead Channel Pacing Threshold Amplitude: 1 V
Lead Channel Pacing Threshold Pulse Width: 0.4 ms
Lead Channel Sensing Intrinsic Amplitude: 5.125 mV
Lead Channel Setting Pacing Amplitude: 1.5 V
Lead Channel Setting Pacing Amplitude: 2 V
Lead Channel Setting Pacing Pulse Width: 0.4 ms
Lead Channel Setting Sensing Sensitivity: 0.9 mV
MDC IDC LEAD IMPLANT DT: 20171006
MDC IDC LEAD LOCATION: 753860
MDC IDC MSMT BATTERY REMAINING LONGEVITY: 107 mo
MDC IDC MSMT LEADCHNL RA PACING THRESHOLD AMPLITUDE: 0.75 V
MDC IDC MSMT LEADCHNL RA PACING THRESHOLD PULSEWIDTH: 0.4 ms
MDC IDC MSMT LEADCHNL RA SENSING INTR AMPL: 1.75 mV
MDC IDC MSMT LEADCHNL RA SENSING INTR AMPL: 1.75 mV
MDC IDC MSMT LEADCHNL RV SENSING INTR AMPL: 5.125 mV
MDC IDC SESS DTM: 20180711123738
MDC IDC STAT BRADY AP VS PERCENT: 88.65 %
MDC IDC STAT BRADY RA PERCENT PACED: 88.4 %

## 2017-06-07 ENCOUNTER — Telehealth: Payer: Self-pay | Admitting: Cardiology

## 2017-06-07 ENCOUNTER — Ambulatory Visit (INDEPENDENT_AMBULATORY_CARE_PROVIDER_SITE_OTHER): Payer: Medicare HMO | Admitting: *Deleted

## 2017-06-07 DIAGNOSIS — I495 Sick sinus syndrome: Secondary | ICD-10-CM | POA: Diagnosis not present

## 2017-06-07 NOTE — Telephone Encounter (Signed)
LMOVM reminding pt to send remote transmission.   

## 2017-06-07 NOTE — Progress Notes (Signed)
Remote pacemaker transmission.   

## 2017-06-09 ENCOUNTER — Encounter: Payer: Self-pay | Admitting: Cardiology

## 2017-06-13 LAB — CUP PACEART REMOTE DEVICE CHECK
Battery Remaining Longevity: 100 mo
Brady Statistic AP VS Percent: 93.97 %
Brady Statistic AS VP Percent: 0 %
Brady Statistic RA Percent Paced: 93.99 %
Date Time Interrogation Session: 20181010192020
Implantable Lead Implant Date: 20171006
Implantable Lead Location: 753860
Implantable Lead Model: 5076
Lead Channel Impedance Value: 342 Ohm
Lead Channel Pacing Threshold Pulse Width: 0.4 ms
Lead Channel Pacing Threshold Pulse Width: 0.4 ms
Lead Channel Sensing Intrinsic Amplitude: 2.25 mV
Lead Channel Sensing Intrinsic Amplitude: 2.25 mV
Lead Channel Sensing Intrinsic Amplitude: 7 mV
Lead Channel Sensing Intrinsic Amplitude: 7 mV
Lead Channel Setting Pacing Pulse Width: 0.4 ms
MDC IDC LEAD IMPLANT DT: 20171006
MDC IDC LEAD LOCATION: 753859
MDC IDC MSMT BATTERY VOLTAGE: 3.01 V
MDC IDC MSMT LEADCHNL RA IMPEDANCE VALUE: 494 Ohm
MDC IDC MSMT LEADCHNL RA PACING THRESHOLD AMPLITUDE: 0.875 V
MDC IDC MSMT LEADCHNL RV IMPEDANCE VALUE: 456 Ohm
MDC IDC MSMT LEADCHNL RV IMPEDANCE VALUE: 608 Ohm
MDC IDC MSMT LEADCHNL RV PACING THRESHOLD AMPLITUDE: 1 V
MDC IDC PG IMPLANT DT: 20171006
MDC IDC SET LEADCHNL RA PACING AMPLITUDE: 1.75 V
MDC IDC SET LEADCHNL RV PACING AMPLITUDE: 2 V
MDC IDC SET LEADCHNL RV SENSING SENSITIVITY: 0.9 mV
MDC IDC STAT BRADY AP VP PERCENT: 0.04 %
MDC IDC STAT BRADY AS VS PERCENT: 5.99 %
MDC IDC STAT BRADY RV PERCENT PACED: 0.04 %

## 2017-09-06 ENCOUNTER — Ambulatory Visit (INDEPENDENT_AMBULATORY_CARE_PROVIDER_SITE_OTHER): Payer: Medicare HMO | Admitting: *Deleted

## 2017-09-06 DIAGNOSIS — I495 Sick sinus syndrome: Secondary | ICD-10-CM | POA: Diagnosis not present

## 2017-09-06 NOTE — Progress Notes (Signed)
Remote pacemaker transmission.   

## 2017-09-07 ENCOUNTER — Encounter: Payer: Self-pay | Admitting: Cardiology

## 2017-09-07 LAB — CUP PACEART REMOTE DEVICE CHECK
Battery Remaining Longevity: 107 mo
Brady Statistic AP VP Percent: 0.05 %
Brady Statistic AP VS Percent: 87.75 %
Brady Statistic AS VP Percent: 0.23 %
Brady Statistic AS VS Percent: 11.97 %
Brady Statistic RV Percent Paced: 0.27 %
Date Time Interrogation Session: 20190109143252
Implantable Lead Implant Date: 20171006
Implantable Lead Location: 753859
Implantable Lead Model: 5076
Lead Channel Impedance Value: 456 Ohm
Lead Channel Impedance Value: 513 Ohm
Lead Channel Impedance Value: 589 Ohm
Lead Channel Pacing Threshold Amplitude: 1 V
Lead Channel Sensing Intrinsic Amplitude: 3 mV
Lead Channel Sensing Intrinsic Amplitude: 6.25 mV
Lead Channel Sensing Intrinsic Amplitude: 6.25 mV
Lead Channel Setting Pacing Amplitude: 2 V
Lead Channel Setting Pacing Pulse Width: 0.4 ms
Lead Channel Setting Sensing Sensitivity: 0.9 mV
MDC IDC LEAD IMPLANT DT: 20171006
MDC IDC LEAD LOCATION: 753860
MDC IDC MSMT BATTERY VOLTAGE: 3.01 V
MDC IDC MSMT LEADCHNL RA IMPEDANCE VALUE: 342 Ohm
MDC IDC MSMT LEADCHNL RA PACING THRESHOLD AMPLITUDE: 0.875 V
MDC IDC MSMT LEADCHNL RA PACING THRESHOLD PULSEWIDTH: 0.4 ms
MDC IDC MSMT LEADCHNL RA SENSING INTR AMPL: 3 mV
MDC IDC MSMT LEADCHNL RV PACING THRESHOLD PULSEWIDTH: 0.4 ms
MDC IDC PG IMPLANT DT: 20171006
MDC IDC SET LEADCHNL RA PACING AMPLITUDE: 2 V
MDC IDC STAT BRADY RA PERCENT PACED: 87.11 %

## 2017-09-20 ENCOUNTER — Encounter: Payer: Medicare HMO | Admitting: Internal Medicine

## 2017-10-04 ENCOUNTER — Ambulatory Visit: Payer: Medicare HMO | Admitting: Internal Medicine

## 2017-10-04 ENCOUNTER — Encounter: Payer: Self-pay | Admitting: Internal Medicine

## 2017-10-04 VITALS — BP 134/76 | HR 70 | Ht 67.0 in | Wt 151.0 lb

## 2017-10-04 DIAGNOSIS — I10 Essential (primary) hypertension: Secondary | ICD-10-CM

## 2017-10-04 DIAGNOSIS — I495 Sick sinus syndrome: Secondary | ICD-10-CM

## 2017-10-04 DIAGNOSIS — I480 Paroxysmal atrial fibrillation: Secondary | ICD-10-CM

## 2017-10-04 LAB — CUP PACEART INCLINIC DEVICE CHECK
Battery Remaining Longevity: 104 mo
Brady Statistic AP VS Percent: 90.04 %
Brady Statistic AS VS Percent: 9.79 %
Date Time Interrogation Session: 20190206103244
Implantable Lead Implant Date: 20171006
Implantable Lead Location: 753860
Implantable Lead Model: 5076
Lead Channel Impedance Value: 342 Ohm
Lead Channel Impedance Value: 570 Ohm
Lead Channel Impedance Value: 608 Ohm
Lead Channel Pacing Threshold Amplitude: 1 V
Lead Channel Pacing Threshold Pulse Width: 0.4 ms
Lead Channel Sensing Intrinsic Amplitude: 8.5 mV
Lead Channel Setting Pacing Amplitude: 2 V
Lead Channel Setting Pacing Pulse Width: 0.4 ms
MDC IDC LEAD IMPLANT DT: 20171006
MDC IDC LEAD LOCATION: 753859
MDC IDC MSMT BATTERY VOLTAGE: 3.02 V
MDC IDC MSMT LEADCHNL RA PACING THRESHOLD AMPLITUDE: 1 V
MDC IDC MSMT LEADCHNL RA PACING THRESHOLD PULSEWIDTH: 0.4 ms
MDC IDC MSMT LEADCHNL RA SENSING INTR AMPL: 3.375 mV
MDC IDC MSMT LEADCHNL RV IMPEDANCE VALUE: 456 Ohm
MDC IDC PG IMPLANT DT: 20171006
MDC IDC SET LEADCHNL RV PACING AMPLITUDE: 2.5 V
MDC IDC SET LEADCHNL RV SENSING SENSITIVITY: 0.9 mV
MDC IDC STAT BRADY AP VP PERCENT: 0.05 %
MDC IDC STAT BRADY AS VP PERCENT: 0.13 %
MDC IDC STAT BRADY RA PERCENT PACED: 89.7 %
MDC IDC STAT BRADY RV PERCENT PACED: 0.17 %

## 2017-10-04 MED ORDER — ASPIRIN EC 81 MG PO TBEC: 81.0000 mg | DELAYED_RELEASE_TABLET | Freq: Every day | ORAL | 3 refills | Status: DC

## 2017-10-04 NOTE — Patient Instructions (Addendum)
Medication Instructions:  Your physician has recommended you make the following change in your medication:   DECREASE: Aspirin to 81 mg daily  Labwork: None ordered  Testing/Procedures: None ordered  Follow-Up: Remote monitoring is used to monitor your Pacemaker from home. This monitoring reduces the number of office visits required to check your device to one time per year. It allows us to keep an eye on the functioning of your device to ensure it is working properly. You are scheduled for a device check from home on 12/06/17. You may send your transmission at any time that day. If you have a wireless device, the transmission will be sent automatically. After your physician reviews your transmission, you will receive a postcard with your next transmission date.  Your physician wants you to follow-up in: 6 months with Rudi Cocoonna Carroll, NP in the Afib Clinic. You will receive a reminder letter in the mail two months in advance. If you don't receive a letter, please call our office to schedule the follow-up appointment.   Your physician wants you to follow-up in: 1 year with Dr. [name]. You will receive a reminder letter in the mail two months in advance. If you don't receive a letter, please call our office to schedule the follow-up appointment.   Any Other Special Instructions Will Be Listed Below (If Applicable).     If you need a refill on your cardiac medications before your next appointment, please call your pharmacy.

## 2017-10-04 NOTE — Progress Notes (Signed)
    PCP: Shelbie AmmonsHaque, Imran P, MD   Primary EP:  Dr Leota SauersAllred  Emily [name] is a 77 y.o. female who presents today for routine electrophysiology followup.  Since last being seen in our clinic, the patient reports doing very well.  Today, she denies symptoms of palpitations, chest pain, shortness of breath,  lower extremity edema, dizziness, presyncope, or syncope.  The patient is otherwise without complaint today.   Past Medical History:  Diagnosis Date  . Atrial fibrillation (HCC)   . HTN (hypertension)    11/20/12 -ECHO  EF 55-60%  . PVC's (premature ventricular contractions)   . Right bundle branch block   . Tachy-brady syndrome [facility](HCC)    Past Surgical History:  Procedure Laterality Date  . EP IMPLANTABLE DEVICE N/A 06/03/2016   MDT Advisa MRI compatible pacemaker implanted by Dr Elberta Fortisamnitz for post termination pauses with AF.  . TONSILLECTOMY      ROS- all systems are reviewed and negative except as per HPI above  Current Outpatient Medications  Medication Sig Dispense Refill  . aspirin EC 325 MG tablet Take 1 tablet (325 mg total) by mouth daily. 30 tablet 0  . atenolol (TENORMIN) 100 MG tablet Take 1 tablet (100 mg total) by mouth 2 (two) times daily. 60 tablet 6  . B Complex Vitamins (VITAMIN-B COMPLEX) TABS Take 1 tablet by mouth daily as needed (takes occassionally for vitamin supplementation).     . Coenzyme Q10 (COQ10 PO) Take 1 tablet by mouth daily as needed (takes occassionally for vitamin supplementation).     Marland Kitchen. diltiazem (CARDIZEM CD) 240 MG 24 hr capsule Take 1 capsule (240 mg total) by mouth daily. 30 capsule 6  . diltiazem (CARDIZEM) 30 MG tablet Take 30 mg by mouth as directed. Take as needed for heart rate elevation    . lisinopril (PRINIVIL,ZESTRIL) 40 MG tablet TAKE ONE TABLET BY MOUTH ONCE DAILY 30 tablet 0  . LORazepam (ATIVAN) 1 MG tablet Take 1 mg by mouth 3 (three) times daily as needed for anxiety.      No current facility-administered medications for this  visit.     Physical Exam: Vitals:   10/04/17 0920  BP: 134/76  Pulse: 70  SpO2: 97%  Weight: 151 lb (68.5 kg)  Height: 5\' 7"  (1.702 m)    GEN- The patient is well appearing, alert and oriented x 3 today.   Head- normocephalic, atraumatic Eyes-  Sclera clear, conjunctiva pink Ears- hearing intact Oropharynx- clear Lungs- Clear to ausculation bilaterally, normal work of breathing Chest- pacemaker pocket is well healed Heart- Regular rate and rhythm, no murmurs, rubs or gallops, PMI not laterally displaced GI- soft, NT, ND, + BS Extremities- no clubbing, cyanosis, or edema  Pacemaker interrogation- reviewed in detail today,  See PACEART report   Assessment and Plan:  1. Symptomatic sinus bradycardia (post termination pauses) Normal pacemaker function See Pace Art report No changes today  2. Paroxysmal atrial fibrillation afib burden is 0.8 % (0.8% last visit) chads2vasc score is 3.  She declines anticoagulation again today I have offered that ASA is not beneficial in stroke reduction with afib.  I have advised that at least she should reduce ASA to 81mg  daily but that clearly guidelines would favor anticoagulation for her.  3. HTN Stable No change required today  [facility] Follow-up with AF clinic in 6 months I will see in a year  Hillis RangeJames Jahlisa Rossitto MD, [facility] Center[facility]ACC 10/04/2017 9:24 AM

## 2017-11-28 IMAGING — CT CT CERVICAL SPINE W/O CM
4 of 8 series · 12 of 33 positions shown, 13 images · non-contrast
Comparison: None.

CLINICAL DATA: Status post fall

EXAM:
CT HEAD WITHOUT CONTRAST
CT CERVICAL SPINE WITHOUT CONTRAST
TECHNIQUE: Multidetector CT imaging of the head and cervical spine was
performed following the standard protocol without intravenous
contrast. Multiplanar CT image reconstructions of the cervical spine
were also generated.

[Series 6: head without cor · coronal · non-contrast · 0.32mm/px · 3 of 67 slices shown]
[im 17/67  bone]
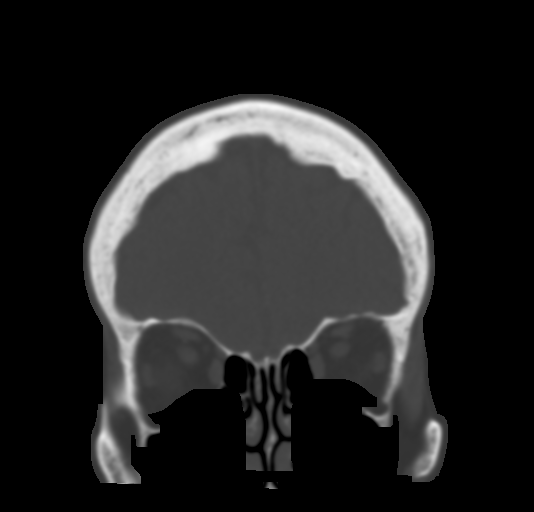
[im 34/67  bone]
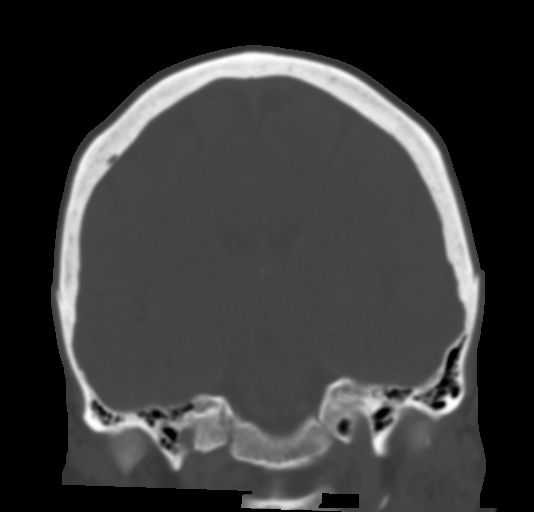
[im 50/67  bone]
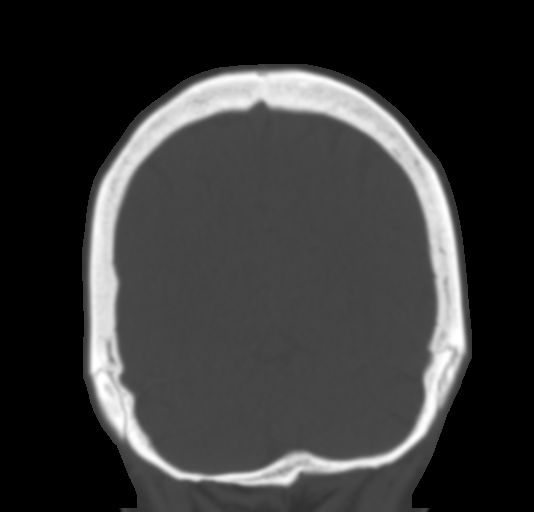

[Series 8: c_spine 2.0 st · axial · 0.26mm/px · z∈[-224,-120]mm · 3 of 104 slices shown, 4 images]
[im 26/104  soft-tissue]
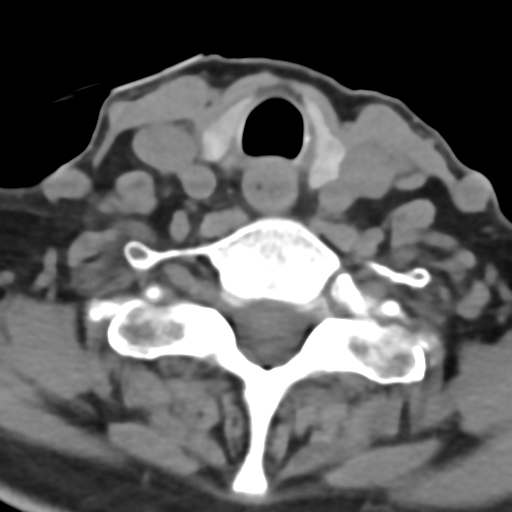
[im 26/104  bone]
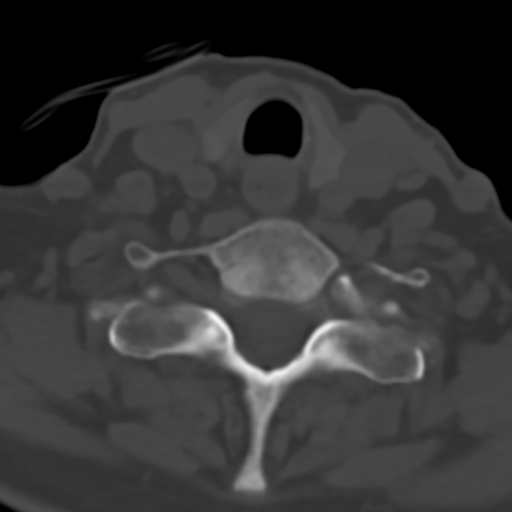
[im 52/104  bone]
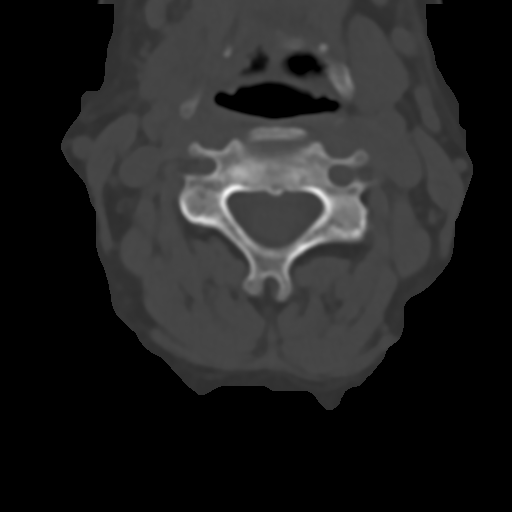
[im 78/104  bone]
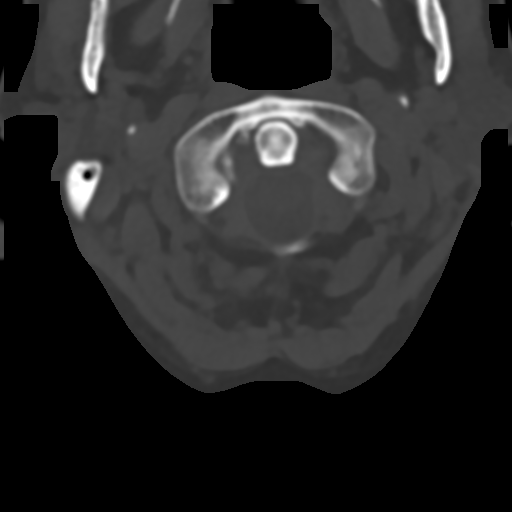

[Series 10: c_spine 2.0 sag bone · sagittal · 0.26mm/px · 4 of 52 slices shown]
[im 11/52  bone]
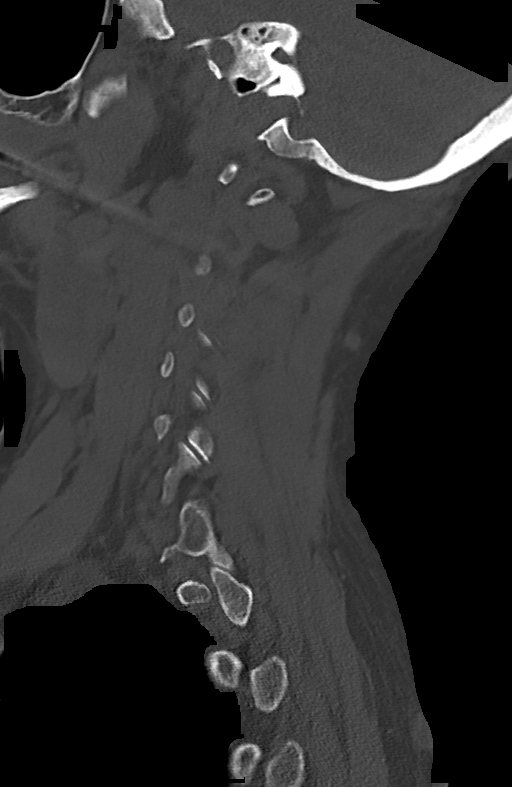
[im 21/52  bone]
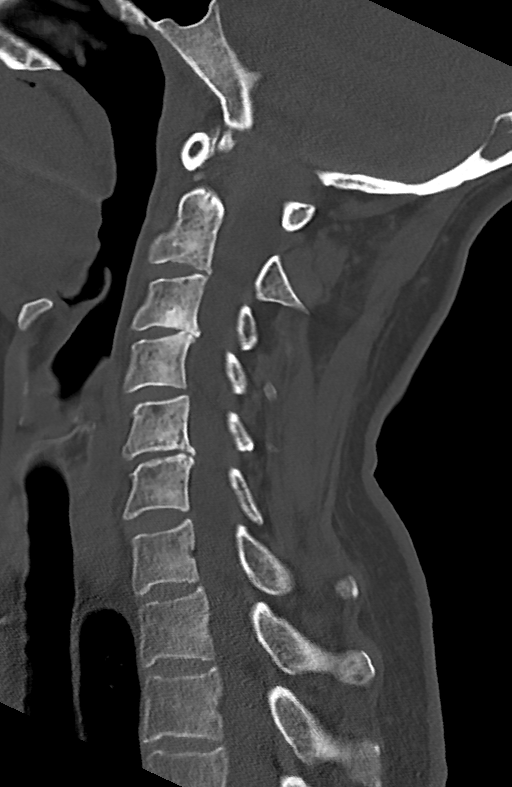
[im 31/52  bone]
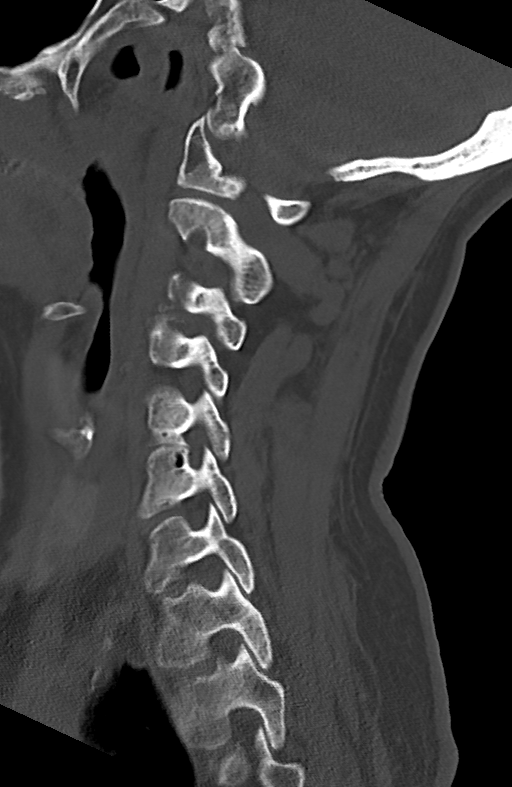
[im 41/52  bone]
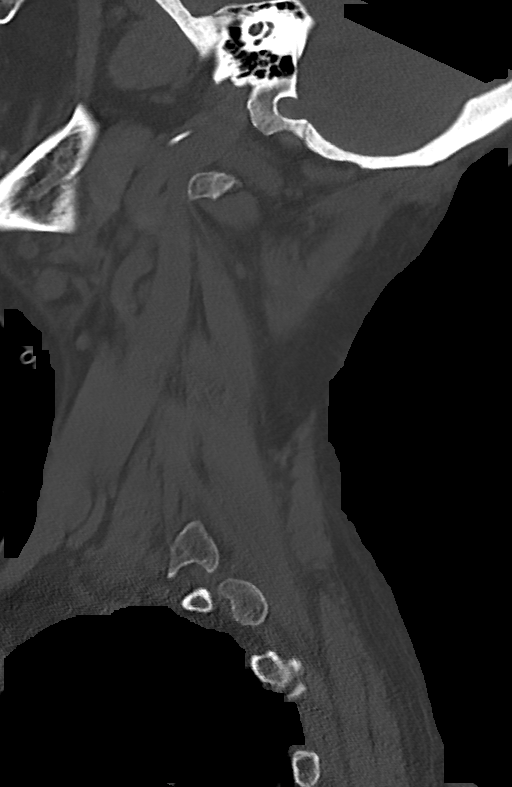

[Series 12: c_spine 2.0 orthogonals · axial · 0.21mm/px · z∈[-235,-175]mm · 2 of 98 slices shown]
[im 33/98  bone]
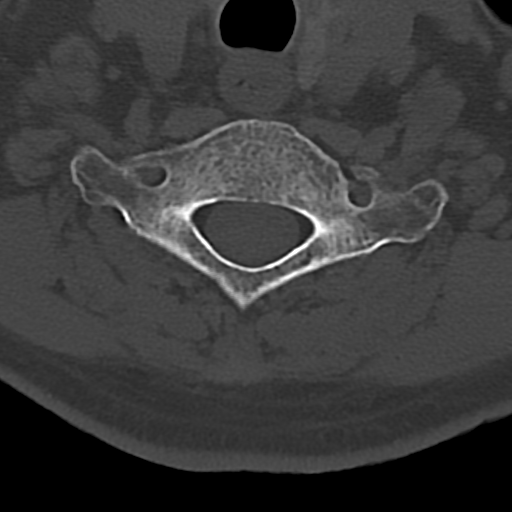
[im 65/98  bone]
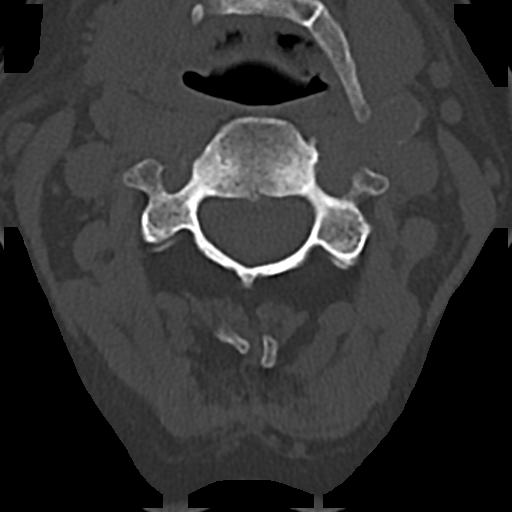

[12 of 33 positions shown; findings below may reference images not displayed]

FINDINGS: CT HEAD FINDINGS

No acute cortical infarct, hemorrhage, or mass lesion ispresent.
Ventricles are of normal size. No significant extra-axial fluid
collection is present. The paranasal sinuses andmastoid air cells
are clear. The osseous skull is intact.

CT CERVICAL SPINE FINDINGS

The alignment of the cervical spine is normal. The vertebral body
heights are well preserved. The facet joints appear well-aligned.
IMPRESSION: 1. No acute intracranial abnormality.
2. No evidence for cervical spine fracture.

## 2017-12-06 ENCOUNTER — Ambulatory Visit (INDEPENDENT_AMBULATORY_CARE_PROVIDER_SITE_OTHER): Payer: Medicare HMO | Admitting: *Deleted

## 2017-12-06 DIAGNOSIS — I495 Sick sinus syndrome: Secondary | ICD-10-CM | POA: Diagnosis not present

## 2017-12-06 NOTE — Progress Notes (Signed)
Remote pacemaker transmission.   

## 2017-12-07 ENCOUNTER — Encounter: Payer: Self-pay | Admitting: Cardiology

## 2018-01-04 LAB — CUP PACEART REMOTE DEVICE CHECK
Battery Remaining Longevity: 97 mo
Battery Voltage: 3.01 V
Brady Statistic RA Percent Paced: 87.56 %
Brady Statistic RV Percent Paced: 0.52 %
Date Time Interrogation Session: 20190410123121
Implantable Lead Implant Date: 20171006
Implantable Lead Location: 753860
Implantable Lead Model: 5076
Implantable Pulse Generator Implant Date: 20171006
Lead Channel Impedance Value: 456 Ohm
Lead Channel Pacing Threshold Pulse Width: 0.4 ms
Lead Channel Sensing Intrinsic Amplitude: 3 mV
Lead Channel Sensing Intrinsic Amplitude: 3 mV
Lead Channel Sensing Intrinsic Amplitude: 6.75 mV
Lead Channel Setting Pacing Pulse Width: 0.4 ms
MDC IDC LEAD IMPLANT DT: 20171006
MDC IDC LEAD LOCATION: 753859
MDC IDC MSMT LEADCHNL RA IMPEDANCE VALUE: 342 Ohm
MDC IDC MSMT LEADCHNL RA IMPEDANCE VALUE: 513 Ohm
MDC IDC MSMT LEADCHNL RA PACING THRESHOLD AMPLITUDE: 0.75 V
MDC IDC MSMT LEADCHNL RV IMPEDANCE VALUE: 608 Ohm
MDC IDC MSMT LEADCHNL RV PACING THRESHOLD AMPLITUDE: 1 V
MDC IDC MSMT LEADCHNL RV PACING THRESHOLD PULSEWIDTH: 0.4 ms
MDC IDC MSMT LEADCHNL RV SENSING INTR AMPL: 6.75 mV
MDC IDC SET LEADCHNL RA PACING AMPLITUDE: 2 V
MDC IDC SET LEADCHNL RV PACING AMPLITUDE: 2.5 V
MDC IDC SET LEADCHNL RV SENSING SENSITIVITY: 0.9 mV
MDC IDC STAT BRADY AP VP PERCENT: 0.05 %
MDC IDC STAT BRADY AP VS PERCENT: 88.47 %
MDC IDC STAT BRADY AS VP PERCENT: 0.46 %
MDC IDC STAT BRADY AS VS PERCENT: 11.01 %

## 2018-03-07 ENCOUNTER — Telehealth: Payer: Self-pay | Admitting: Cardiology

## 2018-03-07 ENCOUNTER — Ambulatory Visit (INDEPENDENT_AMBULATORY_CARE_PROVIDER_SITE_OTHER): Payer: Medicare HMO | Admitting: *Deleted

## 2018-03-07 DIAGNOSIS — I495 Sick sinus syndrome: Secondary | ICD-10-CM

## 2018-03-07 NOTE — Telephone Encounter (Signed)
LMOVM reminding pt to send remote transmission.   

## 2018-03-08 NOTE — Progress Notes (Signed)
Remote pacemaker transmission.   

## 2018-04-04 ENCOUNTER — Encounter (HOSPITAL_COMMUNITY): Payer: Self-pay | Admitting: Nurse Practitioner

## 2018-04-04 ENCOUNTER — Ambulatory Visit (HOSPITAL_COMMUNITY)
Admission: RE | Admit: 2018-04-04 | Discharge: 2018-04-04 | Disposition: A | Discharge status: Home / Self Care | Payer: Medicare HMO | Source: Ambulatory Visit | Attending: Nurse Practitioner | Admitting: Nurse Practitioner

## 2018-04-04 VITALS — BP 118/82 | HR 67 | Ht 67.0 in | Wt 146.0 lb

## 2018-04-04 DIAGNOSIS — I480 Paroxysmal atrial fibrillation: Secondary | ICD-10-CM | POA: Diagnosis present

## 2018-04-04 DIAGNOSIS — Z87891 Personal history of nicotine dependence: Secondary | ICD-10-CM | POA: Diagnosis not present

## 2018-04-04 DIAGNOSIS — I4510 Unspecified right bundle-branch block: Secondary | ICD-10-CM | POA: Insufficient documentation

## 2018-04-04 DIAGNOSIS — I10 Essential (primary) hypertension: Secondary | ICD-10-CM | POA: Diagnosis not present

## 2018-04-04 DIAGNOSIS — I493 Ventricular premature depolarization: Secondary | ICD-10-CM | POA: Insufficient documentation

## 2018-04-04 DIAGNOSIS — Z7982 Long term (current) use of aspirin: Secondary | ICD-10-CM | POA: Insufficient documentation

## 2018-04-04 DIAGNOSIS — Z79899 Other long term (current) drug therapy: Secondary | ICD-10-CM | POA: Insufficient documentation

## 2018-04-04 DIAGNOSIS — I495 Sick sinus syndrome: Secondary | ICD-10-CM | POA: Diagnosis not present

## 2018-04-04 NOTE — Progress Notes (Signed)
Primary Care Physician: Emily Ammons, MD Referring Physician: Dr. Leota [name] is a 77 y.o. female with a h/o HTN,PPM, paroxysmal afib, in the afib clinc for f/u of Dr. Jenel [name] visit in February of this year. She reports that she has been doing well with very low afib burden. She has taken prn 30 mg Cardizem  afib only once since seeing Dr. Johney [name], afib episode was short lived..   Today, she denies symptoms of palpitations, chest pain, shortness of breath, orthopnea, PND, lower extremity edema, dizziness, presyncope, syncope, or neurologic sequela. The patient is tolerating medications without difficulties and is otherwise without complaint today.   Past Medical History:  Diagnosis Date  . Atrial fibrillation (HCC)   . HTN (hypertension)    11/20/12 -ECHO  EF 55-60%  . PVC's (premature ventricular contractions)   . Right bundle branch block   . Tachy-brady syndrome [facility])    Past Surgical History:  Procedure Laterality Date  . EP IMPLANTABLE DEVICE N/A 06/03/2016   MDT Advisa MRI compatible pacemaker implanted by Dr Emily [name] for post termination pauses with AF.  . TONSILLECTOMY      Current Outpatient Medications  Medication Sig Dispense Refill  . ALPRAZolam (XANAX) 1 MG tablet Take 1 mg by mouth 3 (three) times daily as needed for anxiety.    Marland Kitchen aspirin EC 81 MG tablet Take 1 tablet (81 mg total) by mouth daily. 90 tablet 3  . atenolol (TENORMIN) 100 MG tablet Take 1 tablet (100 mg total) by mouth 2 (two) times daily. 60 tablet 6  . B Complex Vitamins (VITAMIN-B COMPLEX) TABS Take 1 tablet by mouth daily as needed (takes occassionally for vitamin supplementation).     . Coenzyme Q10 (COQ10 PO) Take 1 tablet by mouth daily as needed (takes occassionally for vitamin supplementation).     Marland Kitchen diltiazem (CARDIZEM CD) 240 MG 24 hr capsule Take 1 capsule (240 mg total) by mouth daily. 30 capsule 6  . lisinopril (PRINIVIL,ZESTRIL) 40 MG tablet TAKE ONE TABLET BY MOUTH ONCE  DAILY 30 tablet 0  . diltiazem (CARDIZEM) 30 MG tablet Take 30 mg by mouth as directed. Take as needed for heart rate elevation     No current facility-administered medications for this encounter.     No Known Allergies  Social History   Socioeconomic History  . Marital status: Married    Spouse name: Not on file  . Number of children: 3  . Years of education: Not on file  . Highest education level: Not on file  Occupational History  . Occupation: [name]  Social Needs  . Financial resource strain: Not on file  . Food insecurity:    Worry: Not on file    Inability: Not on file  . Transportation needs:    Medical: Not on file    Non-medical: Not on file  Tobacco Use  . Smoking status: Former Smoker    Last attempt to quit: 09/01/1976    Years since quitting: 41.6  . Smokeless tobacco: Never Used  Substance and Sexual Activity  . Alcohol use: Yes    Alcohol/week: 0.0 oz    Comment: occassional  . Drug use: No  . Sexual activity: Not on file  Lifestyle  . Physical activity:    Days per week: Not on file    Minutes per session: Not on file  . Stress: Not on file  Relationships  . Social connections:    Talks on phone: Not on  file    Gets together: Not on file    Attends religious service: Not on file    Active member of club or organization: Not on file    Attends meetings of clubs or organizations: Not on file    Relationship status: Not on file  . Intimate partner violence:    Fear of current or ex partner: Not on file    Emotionally abused: Not on file    Physically abused: Not on file    Forced sexual activity: Not on file  Other Topics Concern  . Not on file  Social History Narrative   Lives in [name].  Cares for her husband who is disabled.   Retired from Designer, fashion/clothing[employer]    Family History  Problem Relation Age of Onset  . Aneurysm Mother   . Heart disease Father     ROS- All systems are reviewed and negative except as per the HPI  above  Physical Exam: Vitals:   04/04/18 0929  BP: 118/82  Pulse: 67  Weight: 146 lb (66.2 kg)  Height: 5\' 7"  (1.702 m)   Wt Readings from Last 3 Encounters:  04/04/18 146 lb (66.2 kg)  10/04/17 151 lb (68.5 kg)  01/10/17 145 lb (65.8 kg)    Labs: Lab Results  Component Value Date   NA 140 06/03/2016   K 4.1 06/03/2016   CL 106 06/03/2016   CO2 23 06/03/2016   GLUCOSE 122 (H) 06/03/2016   BUN 21 (H) 06/03/2016   CREATININE 1.05 (H) 06/03/2016   CALCIUM 9.5 06/03/2016   MG 1.9 06/03/2016   No results found for: INR No results found for: CHOL, HDL, LDLCALC, TRIG   GEN- The patient is well appearing, alert and oriented x 3 today.   Head- normocephalic, atraumatic Eyes-  Sclera clear, conjunctiva pink Ears- hearing intact Oropharynx- clear Neck- supple, no JVP Lymph- no cervical lymphadenopathy Lungs- Clear to ausculation bilaterally, normal work of breathing Heart- Regular rate and rhythm, no murmurs, rubs or gallops, PMI not laterally displaced GI- soft, NT, ND, + BS Extremities- no clubbing, cyanosis, or edema MS- no significant deformity or atrophy Skin- no rash or lesion Psych- euthymic mood, full affect Neuro- strength and sensation are intact  EKG-atrial paced rhythm at 67 bpm, pr int 214 ms, qrs int 122ms, qtc 403 ms Epic records reviewed    Assessment and Plan: 1. Paroxysmal afib  Currently heart burden is low Continue to use 30 mg Cardizem as needed Chadsvasc score is 3, she is still declining DOAC and will continue ASA  2. HTN Stable   3. PPM Per Dr. [name] Remote report due 10/9  F/u with Dr. [name]  per recall afib clinic as needed

## 2018-04-05 LAB — CUP PACEART REMOTE DEVICE CHECK
Brady Statistic AP VS Percent: 93.04 %
Brady Statistic AS VP Percent: 0.33 %
Brady Statistic RA Percent Paced: 92.53 %
Implantable Lead Implant Date: 20171006
Implantable Lead Location: 753859
Implantable Lead Location: 753860
Implantable Lead Model: 5076
Lead Channel Impedance Value: 342 Ohm
Lead Channel Pacing Threshold Amplitude: 1 V
Lead Channel Pacing Threshold Pulse Width: 0.4 ms
Lead Channel Pacing Threshold Pulse Width: 0.4 ms
Lead Channel Sensing Intrinsic Amplitude: 2 mV
Lead Channel Sensing Intrinsic Amplitude: 2 mV
Lead Channel Sensing Intrinsic Amplitude: 6.75 mV
Lead Channel Setting Pacing Pulse Width: 0.4 ms
MDC IDC LEAD IMPLANT DT: 20171006
MDC IDC MSMT BATTERY REMAINING LONGEVITY: 95 mo
MDC IDC MSMT BATTERY VOLTAGE: 3.01 V
MDC IDC MSMT LEADCHNL RA IMPEDANCE VALUE: 646 Ohm
MDC IDC MSMT LEADCHNL RV IMPEDANCE VALUE: 456 Ohm
MDC IDC MSMT LEADCHNL RV IMPEDANCE VALUE: 532 Ohm
MDC IDC MSMT LEADCHNL RV PACING THRESHOLD AMPLITUDE: 0.625 V
MDC IDC MSMT LEADCHNL RV SENSING INTR AMPL: 6.75 mV
MDC IDC PG IMPLANT DT: 20171006
MDC IDC SESS DTM: 20190710181901
MDC IDC SET LEADCHNL RA PACING AMPLITUDE: 2 V
MDC IDC SET LEADCHNL RV PACING AMPLITUDE: 2.5 V
MDC IDC SET LEADCHNL RV SENSING SENSITIVITY: 0.9 mV
MDC IDC STAT BRADY AP VP PERCENT: 0.06 %
MDC IDC STAT BRADY AS VS PERCENT: 6.57 %
MDC IDC STAT BRADY RV PERCENT PACED: 0.39 %

## 2018-06-06 ENCOUNTER — Ambulatory Visit (INDEPENDENT_AMBULATORY_CARE_PROVIDER_SITE_OTHER): Payer: Medicare HMO | Admitting: *Deleted

## 2018-06-06 DIAGNOSIS — I495 Sick sinus syndrome: Secondary | ICD-10-CM

## 2018-06-07 NOTE — Progress Notes (Signed)
Remote pacemaker transmission.   

## 2018-07-17 LAB — CUP PACEART REMOTE DEVICE CHECK
Brady Statistic AP VP Percent: 0.07 %
Brady Statistic AS VP Percent: 0.13 %
Brady Statistic AS VS Percent: 10.94 %
Brady Statistic RV Percent Paced: 0.21 %
Implantable Lead Implant Date: 20171006
Implantable Lead Location: 753860
Lead Channel Impedance Value: 323 Ohm
Lead Channel Impedance Value: 437 Ohm
Lead Channel Impedance Value: 627 Ohm
Lead Channel Pacing Threshold Amplitude: 1 V
Lead Channel Pacing Threshold Amplitude: 1.125 V
Lead Channel Pacing Threshold Pulse Width: 0.4 ms
Lead Channel Sensing Intrinsic Amplitude: 2.875 mV
Lead Channel Sensing Intrinsic Amplitude: 2.875 mV
Lead Channel Sensing Intrinsic Amplitude: 6.25 mV
Lead Channel Sensing Intrinsic Amplitude: 6.25 mV
Lead Channel Setting Sensing Sensitivity: 0.9 mV
MDC IDC LEAD IMPLANT DT: 20171006
MDC IDC LEAD LOCATION: 753859
MDC IDC MSMT BATTERY REMAINING LONGEVITY: 93 mo
MDC IDC MSMT BATTERY VOLTAGE: 3.01 V
MDC IDC MSMT LEADCHNL RA PACING THRESHOLD PULSEWIDTH: 0.4 ms
MDC IDC MSMT LEADCHNL RV IMPEDANCE VALUE: 570 Ohm
MDC IDC PG IMPLANT DT: 20171006
MDC IDC SESS DTM: 20191009125729
MDC IDC SET LEADCHNL RA PACING AMPLITUDE: 2 V
MDC IDC SET LEADCHNL RV PACING AMPLITUDE: 2.5 V
MDC IDC SET LEADCHNL RV PACING PULSEWIDTH: 0.4 ms
MDC IDC STAT BRADY AP VS PERCENT: 88.86 %
MDC IDC STAT BRADY RA PERCENT PACED: 88.48 %

## 2018-09-05 ENCOUNTER — Ambulatory Visit (INDEPENDENT_AMBULATORY_CARE_PROVIDER_SITE_OTHER): Payer: Medicare HMO

## 2018-09-05 DIAGNOSIS — I495 Sick sinus syndrome: Secondary | ICD-10-CM

## 2018-09-06 LAB — CUP PACEART REMOTE DEVICE CHECK
Battery Remaining Longevity: 91 mo
Brady Statistic AP VP Percent: 0.06 %
Brady Statistic AP VS Percent: 84.93 %
Brady Statistic AS VP Percent: 0.27 %
Brady Statistic RV Percent Paced: 0.34 %
Date Time Interrogation Session: 20200108193636
Implantable Lead Location: 753859
Implantable Lead Model: 5076
Implantable Pulse Generator Implant Date: 20171006
Lead Channel Impedance Value: 437 Ohm
Lead Channel Impedance Value: 494 Ohm
Lead Channel Impedance Value: 570 Ohm
Lead Channel Pacing Threshold Amplitude: 0.875 V
Lead Channel Sensing Intrinsic Amplitude: 3.25 mV
Lead Channel Sensing Intrinsic Amplitude: 6.75 mV
Lead Channel Sensing Intrinsic Amplitude: 6.75 mV
Lead Channel Setting Pacing Amplitude: 2.5 V
Lead Channel Setting Pacing Pulse Width: 0.4 ms
Lead Channel Setting Sensing Sensitivity: 0.9 mV
MDC IDC LEAD IMPLANT DT: 20171006
MDC IDC LEAD IMPLANT DT: 20171006
MDC IDC LEAD LOCATION: 753860
MDC IDC MSMT BATTERY VOLTAGE: 3.01 V
MDC IDC MSMT LEADCHNL RA IMPEDANCE VALUE: 323 Ohm
MDC IDC MSMT LEADCHNL RA PACING THRESHOLD AMPLITUDE: 0.75 V
MDC IDC MSMT LEADCHNL RA PACING THRESHOLD PULSEWIDTH: 0.4 ms
MDC IDC MSMT LEADCHNL RA SENSING INTR AMPL: 3.25 mV
MDC IDC MSMT LEADCHNL RV PACING THRESHOLD PULSEWIDTH: 0.4 ms
MDC IDC SET LEADCHNL RA PACING AMPLITUDE: 2 V
MDC IDC STAT BRADY AS VS PERCENT: 14.74 %
MDC IDC STAT BRADY RA PERCENT PACED: 84.02 %

## 2018-09-06 NOTE — Progress Notes (Signed)
Remote pacemaker transmission.   

## 2018-12-05 ENCOUNTER — Other Ambulatory Visit: Payer: Self-pay

## 2018-12-05 ENCOUNTER — Ambulatory Visit (INDEPENDENT_AMBULATORY_CARE_PROVIDER_SITE_OTHER): Payer: Medicare HMO | Admitting: *Deleted

## 2018-12-05 DIAGNOSIS — I495 Sick sinus syndrome: Secondary | ICD-10-CM | POA: Diagnosis not present

## 2018-12-05 LAB — CUP PACEART REMOTE DEVICE CHECK
Battery Remaining Longevity: 88 mo
Battery Voltage: 3.01 V
Brady Statistic AP VP Percent: 0.07 %
Brady Statistic AP VS Percent: 86.53 %
Brady Statistic AS VP Percent: 0.42 %
Brady Statistic AS VS Percent: 12.98 %
Brady Statistic RA Percent Paced: 85.38 %
Brady Statistic RV Percent Paced: 0.49 %
Date Time Interrogation Session: 20200408122114
Implantable Lead Implant Date: 20171006
Implantable Lead Implant Date: 20171006
Implantable Lead Location: 753859
Implantable Lead Location: 753860
Implantable Lead Model: 5076
Implantable Lead Model: 5076
Implantable Pulse Generator Implant Date: 20171006
Lead Channel Impedance Value: 323 Ohm
Lead Channel Impedance Value: 437 Ohm
Lead Channel Impedance Value: 456 Ohm
Lead Channel Impedance Value: 589 Ohm
Lead Channel Pacing Threshold Amplitude: 0.625 V
Lead Channel Pacing Threshold Amplitude: 1.125 V
Lead Channel Pacing Threshold Pulse Width: 0.4 ms
Lead Channel Pacing Threshold Pulse Width: 0.4 ms
Lead Channel Sensing Intrinsic Amplitude: 2.875 mV
Lead Channel Sensing Intrinsic Amplitude: 2.875 mV
Lead Channel Sensing Intrinsic Amplitude: 6 mV
Lead Channel Sensing Intrinsic Amplitude: 6 mV
Lead Channel Setting Pacing Amplitude: 2 V
Lead Channel Setting Pacing Amplitude: 2.5 V
Lead Channel Setting Pacing Pulse Width: 0.4 ms
Lead Channel Setting Sensing Sensitivity: 0.9 mV

## 2018-12-10 ENCOUNTER — Telehealth (INDEPENDENT_AMBULATORY_CARE_PROVIDER_SITE_OTHER): Payer: Medicare HMO | Admitting: Internal Medicine

## 2018-12-10 ENCOUNTER — Other Ambulatory Visit: Payer: Self-pay

## 2018-12-10 DIAGNOSIS — I495 Sick sinus syndrome: Secondary | ICD-10-CM

## 2018-12-10 DIAGNOSIS — I4891 Unspecified atrial fibrillation: Secondary | ICD-10-CM | POA: Diagnosis not present

## 2018-12-10 DIAGNOSIS — T50905D Adverse effect of unspecified drugs, medicaments and biological substances, subsequent encounter: Secondary | ICD-10-CM

## 2018-12-10 DIAGNOSIS — I10 Essential (primary) hypertension: Secondary | ICD-10-CM

## 2018-12-10 DIAGNOSIS — R001 Bradycardia, unspecified: Secondary | ICD-10-CM

## 2018-12-10 DIAGNOSIS — T50905A Adverse effect of unspecified drugs, medicaments and biological substances, initial encounter: Secondary | ICD-10-CM

## 2018-12-10 NOTE — Progress Notes (Signed)
Electrophysiology TeleHealth Note   Due to national recommendations of social distancing due to COVID 19, an audio telehealth visit is felt to be most appropriate for this patient at this time.  Verbal consent obtained from patient today.  She has a land line only and nointernet.  I therefore could not perform virtual visit today.   Date:  12/10/2018   ID:  Emily [name], DOB [birth year], MRN [id number]  Location: patient's home  Provider location: [address] [place]  Evaluation Performed: Follow-up visit  PCP:  Shelbie Ammons, MD   Electrophysiologist:  Dr Johney Frame  Chief Complaint:  afib  History of Present Illness:    Emily [name] is a 78 y.o. female who presents via audio conferencing for a telehealth visit today.  Since last being seen in our clinic, the patient reports doing very well.  Her afib symptoms are well controlled.  Today, she denies symptoms of palpitations, chest pain, shortness of breath,  lower extremity edema, dizziness, presyncope, or syncope.  The patient is otherwise without complaint today.  The patient denies symptoms of fevers, chills, cough, or new SOB worrisome for COVID 19.  Past Medical History:  Diagnosis Date  . Atrial fibrillation (HCC)   . HTN (hypertension)    11/20/12 -ECHO  EF 55-60%  . PVC's (premature ventricular contractions)   . Right bundle branch block   . Tachy-brady syndrome [facility])     Past Surgical History:  Procedure Laterality Date  . EP IMPLANTABLE DEVICE N/A 06/03/2016   MDT Advisa MRI compatible 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  . TONSILLECTOMY      Current Outpatient Medications  Medication Sig Dispense Refill  . ALPRAZolam (XANAX) 1 MG tablet Take 1 mg by mouth 3 (three) times daily as needed for anxiety.    Marland Kitchen aspirin EC 81 MG tablet Take 1 tablet (81 mg total) by mouth daily. 90 tablet 3  . atenolol (TENORMIN) 100 MG tablet Take 1 tablet (100 mg total) by  mouth 2 (two) times daily. 60 tablet 6  . B Complex Vitamins (VITAMIN-B COMPLEX) TABS Take 1 tablet by mouth daily as needed (takes occassionally for vitamin supplementation).     . Coenzyme Q10 (COQ10 PO) Take 1 tablet by mouth daily as needed (takes occassionally for vitamin supplementation).     Marland Kitchen diltiazem (CARDIZEM CD) 240 MG 24 hr capsule Take 1 capsule (240 mg total) by mouth daily. 30 capsule 6  . diltiazem (CARDIZEM) 30 MG tablet Take 30 mg by mouth as directed. Take as needed for heart rate elevation    . lisinopril (PRINIVIL,ZESTRIL) 40 MG tablet TAKE ONE TABLET BY MOUTH ONCE DAILY 30 tablet 0   No current facility-administered medications for this visit.     Allergies:   Patient has no known allergies.   Social History:  The patient  reports that she quit smoking about 42 years ago. She has never used smokeless tobacco. She reports current alcohol use. She reports that she does not use drugs.   Family History:  The patient's  family history includes Aneurysm in her mother; Heart disease in her father.   ROS:  Please see the history of present illness.   All other systems are personally reviewed and negative.    Exam:    Vital Signs:  BP 134/73   Pulse 65   Well sounding   Labs/Other Tests and Data Reviewed:    Recent Labs:  No results found for requested labs within last 8760 hours.   Wt Readings from Last 3 Encounters:  04/04/18 146 lb (66.2 kg)  10/04/17 151 lb (68.5 kg)  01/10/17 145 lb (65.8 kg)     Other studies personally reviewed: Additional studies/ records that were reviewed today include: afib clinic notes, my prior notes  Review of the above records today demonstrates: as above   Last device remote is reviewed from PaceART PDF dated 11/2018 which reveals normal device function   ASSESSMENT & PLAN:    1.  Sick sinus syndrome Post termination pauses Normal device function by recent remote.  2. Paroxysmal atrial fibrillation By recent remote ,  afib burden is 2.6%, longest duration 16 hours Pt declines anticoagulation  3. HTN Stable No change required today  4. COVID 19 screen The patient denies symptoms of COVID 19 at this time.  The importance of social distancing was discussed today.  Follow-up:  12 months with EP APP Next remote: 02/2019  Current medicines are reviewed at length with the patient today.   The patient does not have concerns regarding her medicines.  The following changes were made today:  none   Patient Risk:  after full review of this patients clinical status, I feel that they are at moderate risk at this time.  Today, I have spent 10 minutes with the patient with telehealth technology discussing afib .    Randolm IdolSigned, Wilber Fini, MD  12/10/2018 9:53 AM     [facility] [address] [place] [address] [phone] (office) [phone] (fax)

## 2018-12-14 ENCOUNTER — Encounter: Payer: Self-pay | Admitting: Cardiology

## 2018-12-14 ENCOUNTER — Encounter: Payer: Medicare HMO | Admitting: Internal Medicine

## 2018-12-14 NOTE — Progress Notes (Signed)
Remote pacemaker transmission.   

## 2019-01-25 ENCOUNTER — Other Ambulatory Visit (HOSPITAL_COMMUNITY): Payer: Self-pay | Admitting: *Deleted

## 2019-01-25 MED ORDER — ATENOLOL 100 MG PO TABS: 100.0000 mg | ORAL_TABLET | Freq: Two times a day (BID) | ORAL | 2 refills | Status: DC

## 2019-01-25 MED ORDER — DILTIAZEM HCL ER COATED BEADS 240 MG PO CP24: 240.0000 mg | ORAL_CAPSULE | Freq: Every day | ORAL | 2 refills | Status: DC

## 2019-03-06 ENCOUNTER — Ambulatory Visit (INDEPENDENT_AMBULATORY_CARE_PROVIDER_SITE_OTHER): Payer: Medicare HMO | Admitting: *Deleted

## 2019-03-06 DIAGNOSIS — I495 Sick sinus syndrome: Secondary | ICD-10-CM

## 2019-03-07 LAB — CUP PACEART REMOTE DEVICE CHECK
Battery Remaining Longevity: 81 mo
Battery Voltage: 3.01 V
Brady Statistic AP VP Percent: 0.07 %
Brady Statistic AP VS Percent: 88.34 %
Brady Statistic AS VP Percent: 0.34 %
Brady Statistic AS VS Percent: 11.25 %
Brady Statistic RA Percent Paced: 87.12 %
Brady Statistic RV Percent Paced: 0.42 %
Date Time Interrogation Session: 20200709140649
Implantable Lead Implant Date: 20171006
Implantable Lead Implant Date: 20171006
Implantable Lead Location: 753859
Implantable Lead Location: 753860
Implantable Lead Model: 5076
Implantable Lead Model: 5076
Implantable Pulse Generator Implant Date: 20171006
Lead Channel Impedance Value: 323 Ohm
Lead Channel Impedance Value: 437 Ohm
Lead Channel Impedance Value: 475 Ohm
Lead Channel Impedance Value: 570 Ohm
Lead Channel Pacing Threshold Amplitude: 0.875 V
Lead Channel Pacing Threshold Amplitude: 0.875 V
Lead Channel Pacing Threshold Pulse Width: 0.4 ms
Lead Channel Pacing Threshold Pulse Width: 0.4 ms
Lead Channel Sensing Intrinsic Amplitude: 2.875 mV
Lead Channel Sensing Intrinsic Amplitude: 6.75 mV
Lead Channel Setting Pacing Amplitude: 2 V
Lead Channel Setting Pacing Amplitude: 2.5 V
Lead Channel Setting Pacing Pulse Width: 0.4 ms
Lead Channel Setting Sensing Sensitivity: 0.9 mV

## 2019-03-13 ENCOUNTER — Telehealth: Payer: Self-pay | Admitting: Nurse Practitioner

## 2019-03-13 MED ORDER — LISINOPRIL 40 MG PO TABS: 40.0000 mg | ORAL_TABLET | Freq: Every day | ORAL | 6 refills | Status: DC

## 2019-03-13 NOTE — Telephone Encounter (Signed)
Pt calling requesting a refill on Lisinopril sent to [facility] in [place] on [address]. Please address

## 2019-03-14 ENCOUNTER — Other Ambulatory Visit: Payer: Self-pay

## 2019-03-14 MED ORDER — ATENOLOL 100 MG PO TABS: 100.0000 mg | ORAL_TABLET | Freq: Two times a day (BID) | ORAL | 2 refills | Status: DC

## 2019-03-14 MED ORDER — DILTIAZEM HCL ER COATED BEADS 240 MG PO CP24: 240.0000 mg | ORAL_CAPSULE | Freq: Every day | ORAL | 2 refills | Status: DC

## 2019-03-18 ENCOUNTER — Encounter: Payer: Self-pay | Admitting: Cardiology

## 2019-03-18 NOTE — Progress Notes (Signed)
Remote pacemaker transmission.   

## 2019-06-05 ENCOUNTER — Encounter: Payer: Medicare HMO | Admitting: *Deleted

## 2019-06-11 ENCOUNTER — Other Ambulatory Visit (HOSPITAL_COMMUNITY): Payer: Self-pay | Admitting: *Deleted

## 2019-06-11 MED ORDER — METOPROLOL TARTRATE 100 MG PO TABS: 100.0000 mg | ORAL_TABLET | Freq: Two times a day (BID) | ORAL | 3 refills | Status: DC

## 2019-06-18 ENCOUNTER — Ambulatory Visit (INDEPENDENT_AMBULATORY_CARE_PROVIDER_SITE_OTHER): Payer: Medicare HMO | Admitting: *Deleted

## 2019-06-18 DIAGNOSIS — I480 Paroxysmal atrial fibrillation: Secondary | ICD-10-CM

## 2019-06-18 DIAGNOSIS — I495 Sick sinus syndrome: Secondary | ICD-10-CM | POA: Diagnosis not present

## 2019-06-19 LAB — CUP PACEART REMOTE DEVICE CHECK
Battery Remaining Longevity: 78 mo
Battery Voltage: 3.01 V
Brady Statistic AP VP Percent: 0.06 %
Brady Statistic AP VS Percent: 88.34 %
Brady Statistic AS VP Percent: 0.32 %
Brady Statistic AS VS Percent: 11.28 %
Brady Statistic RA Percent Paced: 87.45 %
Brady Statistic RV Percent Paced: 0.39 %
Date Time Interrogation Session: 20201020130536
Implantable Lead Implant Date: 20171006
Implantable Lead Implant Date: 20171006
Implantable Lead Location: 753859
Implantable Lead Location: 753860
Implantable Lead Model: 5076
Implantable Lead Model: 5076
Implantable Pulse Generator Implant Date: 20171006
Lead Channel Impedance Value: 323 Ohm
Lead Channel Impedance Value: 456 Ohm
Lead Channel Impedance Value: 456 Ohm
Lead Channel Impedance Value: 589 Ohm
Lead Channel Pacing Threshold Amplitude: 0.875 V
Lead Channel Pacing Threshold Amplitude: 0.875 V
Lead Channel Pacing Threshold Pulse Width: 0.4 ms
Lead Channel Pacing Threshold Pulse Width: 0.4 ms
Lead Channel Sensing Intrinsic Amplitude: 2.5 mV
Lead Channel Sensing Intrinsic Amplitude: 2.5 mV
Lead Channel Sensing Intrinsic Amplitude: 5.25 mV
Lead Channel Sensing Intrinsic Amplitude: 5.25 mV
Lead Channel Setting Pacing Amplitude: 2 V
Lead Channel Setting Pacing Amplitude: 2.5 V
Lead Channel Setting Pacing Pulse Width: 0.4 ms
Lead Channel Setting Sensing Sensitivity: 0.9 mV

## 2019-07-04 NOTE — Progress Notes (Signed)
Remote pacemaker transmission.   

## 2019-10-24 ENCOUNTER — Telehealth: Payer: Self-pay | Admitting: Internal Medicine

## 2019-10-24 NOTE — Telephone Encounter (Signed)
LMOVM for pt to send a transmission with her home monitor. I left the device clinic and gave her [facility] tech support for additional help.

## 2019-10-24 NOTE — Telephone Encounter (Signed)
New message   Patient has questions about her transmission not being received. Please call. 

## 2019-10-25 ENCOUNTER — Ambulatory Visit (INDEPENDENT_AMBULATORY_CARE_PROVIDER_SITE_OTHER): Payer: Medicare HMO | Admitting: *Deleted

## 2019-10-25 DIAGNOSIS — I495 Sick sinus syndrome: Secondary | ICD-10-CM

## 2019-10-25 LAB — CUP PACEART REMOTE DEVICE CHECK
Battery Remaining Longevity: 75 mo
Battery Voltage: 3.01 V
Brady Statistic AP VP Percent: 0.08 %
Brady Statistic AP VS Percent: 86.96 %
Brady Statistic AS VP Percent: 0.28 %
Brady Statistic AS VS Percent: 12.67 %
Brady Statistic RA Percent Paced: 85.95 %
Brady Statistic RV Percent Paced: 0.38 %
Date Time Interrogation Session: 20210226065618
Implantable Lead Implant Date: 20171006
Implantable Lead Implant Date: 20171006
Implantable Lead Location: 753859
Implantable Lead Location: 753860
Implantable Lead Model: 5076
Implantable Lead Model: 5076
Implantable Pulse Generator Implant Date: 20171006
Lead Channel Impedance Value: 342 Ohm
Lead Channel Impedance Value: 437 Ohm
Lead Channel Impedance Value: 456 Ohm
Lead Channel Impedance Value: 513 Ohm
Lead Channel Pacing Threshold Amplitude: 0.75 V
Lead Channel Pacing Threshold Amplitude: 0.75 V
Lead Channel Pacing Threshold Pulse Width: 0.4 ms
Lead Channel Pacing Threshold Pulse Width: 0.4 ms
Lead Channel Sensing Intrinsic Amplitude: 2.75 mV
Lead Channel Sensing Intrinsic Amplitude: 2.75 mV
Lead Channel Sensing Intrinsic Amplitude: 6.5 mV
Lead Channel Sensing Intrinsic Amplitude: 6.5 mV
Lead Channel Setting Pacing Amplitude: 2 V
Lead Channel Setting Pacing Amplitude: 2.5 V
Lead Channel Setting Pacing Pulse Width: 0.4 ms
Lead Channel Setting Sensing Sensitivity: 0.9 mV

## 2019-10-25 NOTE — Telephone Encounter (Signed)
Transmission received.  Exported to MD for review.

## 2019-10-25 NOTE — Progress Notes (Signed)
PPM Remote  

## 2019-12-20 ENCOUNTER — Encounter: Payer: Medicare HMO | Admitting: Physician Assistant

## 2019-12-27 ENCOUNTER — Encounter: Payer: Self-pay | Admitting: Student

## 2019-12-27 ENCOUNTER — Other Ambulatory Visit: Payer: Self-pay

## 2019-12-27 ENCOUNTER — Ambulatory Visit: Payer: Medicare HMO | Admitting: Student

## 2019-12-27 VITALS — BP 160/62 | HR 62 | Ht 67.0 in | Wt 135.8 lb

## 2019-12-27 DIAGNOSIS — R001 Bradycardia, unspecified: Secondary | ICD-10-CM

## 2019-12-27 DIAGNOSIS — I480 Paroxysmal atrial fibrillation: Secondary | ICD-10-CM | POA: Diagnosis not present

## 2019-12-27 DIAGNOSIS — I493 Ventricular premature depolarization: Secondary | ICD-10-CM | POA: Diagnosis not present

## 2019-12-27 DIAGNOSIS — I495 Sick sinus syndrome: Secondary | ICD-10-CM

## 2019-12-27 DIAGNOSIS — I4891 Unspecified atrial fibrillation: Secondary | ICD-10-CM | POA: Diagnosis not present

## 2019-12-27 DIAGNOSIS — I10 Essential (primary) hypertension: Secondary | ICD-10-CM

## 2019-12-27 LAB — CUP PACEART INCLINIC DEVICE CHECK
Battery Remaining Longevity: 73 mo
Battery Voltage: 3.01 V
Brady Statistic AP VP Percent: 0.07 %
Brady Statistic AP VS Percent: 87.99 %
Brady Statistic AS VP Percent: 0.36 %
Brady Statistic AS VS Percent: 11.58 %
Brady Statistic RA Percent Paced: 86.88 %
Brady Statistic RV Percent Paced: 0.43 %
Date Time Interrogation Session: 20210430094151
Implantable Lead Implant Date: 20171006
Implantable Lead Implant Date: 20171006
Implantable Lead Location: 753859
Implantable Lead Location: 753860
Implantable Lead Model: 5076
Implantable Lead Model: 5076
Implantable Pulse Generator Implant Date: 20171006
Lead Channel Impedance Value: 342 Ohm
Lead Channel Impedance Value: 475 Ohm
Lead Channel Impedance Value: 475 Ohm
Lead Channel Impedance Value: 532 Ohm
Lead Channel Pacing Threshold Amplitude: 0.625 V
Lead Channel Pacing Threshold Amplitude: 0.875 V
Lead Channel Pacing Threshold Pulse Width: 0.4 ms
Lead Channel Pacing Threshold Pulse Width: 0.4 ms
Lead Channel Sensing Intrinsic Amplitude: 2.5 mV
Lead Channel Sensing Intrinsic Amplitude: 3.375 mV
Lead Channel Sensing Intrinsic Amplitude: 5.875 mV
Lead Channel Sensing Intrinsic Amplitude: 8.625 mV
Lead Channel Setting Pacing Amplitude: 2 V
Lead Channel Setting Pacing Amplitude: 2.5 V
Lead Channel Setting Pacing Pulse Width: 0.4 ms
Lead Channel Setting Sensing Sensitivity: 0.9 mV

## 2019-12-27 NOTE — Progress Notes (Signed)
Electrophysiology Office Note Date: 12/27/2019  ID:  Emily [name], DOB 1941-03-21, MRN [id number]  PCP: Bonnita Nasuti, MD Primary Cardiologist: No primary care provider on file. Electrophysiologist: Thompson Grayer, MD   CC: Pacemaker follow-up  Emily [name] is a 79 y.o. female seen today for Thompson Grayer, MD for routine electrophysiology followup.  Since last being seen in our clinic the patient reports doing very well. She has lost wait and been able to be more active.  Her BP is elevated on arrival today, but she states it is normal with her regular PCP follow up q 3 months. She does not check it at home. Atenolol previously decreased. She denies chest pain, palpitations, dyspnea, PND, orthopnea, nausea, vomiting, dizziness, syncope, edema, weight gain, or early satiety.  Device History: Medtronic Dual Chamber PPM implanted 06/03/2016 for SSS/Tachy-brady  Past Medical History:  Diagnosis Date  . Atrial fibrillation ([place])   . HTN (hypertension)    11/20/12 -ECHO  EF 55-60%  . PVC's (premature ventricular contractions)   . Right bundle branch block   . Tachy-brady syndrome [facility])    Past Surgical History:  Procedure Laterality Date  . EP IMPLANTABLE DEVICE N/A 06/03/2016   MDT Advisa MRI compatible pacemaker implanted by Dr Curt Bears for post termination pauses with AF.  . TONSILLECTOMY      Current Outpatient Medications  Medication Sig Dispense Refill  . ALPRAZolam (XANAX) 1 MG tablet Take 1 mg by mouth as needed for anxiety.     Marland Kitchen aspirin EC 81 MG tablet Take 1 tablet (81 mg total) by mouth daily. 90 tablet 3  . atenolol (TENORMIN) 100 MG tablet Take 100 mg by mouth daily.    . B Complex Vitamins (VITAMIN-B COMPLEX) TABS Take 1 tablet by mouth daily as needed (takes occassionally for vitamin supplementation).     . Coenzyme Q10 (COQ10 PO) Take 1 tablet by mouth daily as needed (takes occassionally for vitamin supplementation).     Marland Kitchen diltiazem (CARDIZEM CD) 240 MG 24 hr  capsule Take 1 capsule (240 mg total) by mouth daily. 90 capsule 2  . diltiazem (CARDIZEM) 30 MG tablet Take 30 mg by mouth as directed. Take as needed for heart rate elevation    . lisinopril (ZESTRIL) 40 MG tablet Take 1 tablet (40 mg total) by mouth daily. 30 tablet 6   No current facility-administered medications for this visit.    Allergies:   Patient has no known allergies.   Social History: Social History   Socioeconomic History  . Marital status: Married    Spouse name: Not on file  . Number of children: 3  . Years of education: Not on file  . Highest education level: Not on file  Occupational History  . Occupation: theraputic alternatives  Tobacco Use  . Smoking status: Former Smoker    Quit date: 09/01/1976    Years since quitting: 43.3  . Smokeless tobacco: Never Used  Substance and Sexual Activity  . Alcohol use: Yes    Alcohol/week: 0.0 standard drinks    Comment: occassional  . Drug use: No  . Sexual activity: Not on file  Other Topics Concern  . Not on file  Social History Narrative   Lives in [place].  Cares for her husband who is disabled.   Retired from Acupuncturist of Radio broadcast assistant Strain:   . Difficulty of Paying Living Expenses:   Food Insecurity:   . Worried About Running  Out of Food in the Last Year:   . Ran Out of Food in the Last Year:   Transportation Needs:   . Lack of Transportation (Medical):   Marland Kitchen Lack of Transportation (Non-Medical):   Physical Activity:   . Days of Exercise per Week:   . Minutes of Exercise per Session:   Stress:   . Feeling of Stress :   Social Connections:   . Frequency of Communication with Friends and Family:   . Frequency of Social Gatherings with Friends and Family:   . Attends Religious Services:   . Active Member of Clubs or Organizations:   . Attends Banker Meetings:   Marland Kitchen Marital Status:   Intimate Partner Violence:   . Fear of Current or Ex-Partner:   .  Emotionally Abused:   Marland Kitchen Physically Abused:   . Sexually Abused:     Family History: Family History  Problem Relation Age of Onset  . Aneurysm Mother   . Heart disease Father      Review of Systems: All other systems reviewed and are otherwise negative except as noted above.  Physical Exam: Vitals:   12/27/19 0921  BP: (!) 160/62  Pulse: 62  SpO2: 98%  Weight: 135 lb 12.8 oz (61.6 kg)  Height: 5\' 7"  (1.702 m)     GEN- The patient is well appearing, alert and oriented x 3 today.   HEENT: normocephalic, atraumatic; sclera clear, conjunctiva pink; hearing intact; oropharynx clear; neck supple  Lungs- Clear to ausculation bilaterally, normal work of breathing.  No wheezes, rales, rhonchi Heart- Regular rate and rhythm, no murmurs, rubs or gallops  GI- soft, non-tender, non-distended, bowel sounds present  Extremities- no clubbing, cyanosis, or edema  MS- no significant deformity or atrophy Skin- warm and dry, no rash or lesion; PPM pocket well healed Psych- euthymic mood, full affect Neuro- strength and sensation are intact  PPM Interrogation- reviewed in detail today,  See PACEART report  EKG:  EKG is ordered today. The ekg ordered today shows NSR at 62 bpm, PR interval 208 ms, QRS 124 ms.  Recent Labs: No results found for requested labs within last 8760 hours.   Wt Readings from Last 3 Encounters:  12/27/19 135 lb 12.8 oz (61.6 kg)  04/04/18 146 lb (66.2 kg)  10/04/17 151 lb (68.5 kg)     Other studies Reviewed: Additional studies/ records that were reviewed today include: Previous EP office notes, Previous remote checks, Most recent labwork.   Assessment and Plan:  1. Sick sinus syndrome s/p Medtronic PPM  Normal PPM function See Pace Art report No changes today  2. Paroxysmal AF Burden 2.4% Has declined OAC despite CHA2DS2VASC of at least 4  Discussed at length today  3. HTN Elevated on arrival today. States "normal 120-130s at home" I have asked  her to take her BP at home once daily until her PCP visit next Friday, and take that log to them for further adjustment as warranted.   Current medicines are reviewed at length with the patient today.   The patient does not have concerns regarding her medicines.  The following changes were made today:  none  Labs/ tests ordered today include:  Orders Placed This Encounter  Procedures  . CUP PACEART INCLINIC DEVICE CHECK  . EKG 12-Lead    Disposition:   Follow up with EP APP in 12 Months. Continue q 3 remotes.   Sunday, PA-C  12/27/2019 9:46 AM  [facility] [address] [place] [address] [phone] (office) [phone] (fax)

## 2019-12-27 NOTE — Patient Instructions (Addendum)
Medication Instructions:  none *If you need a refill on your cardiac medications before your next appointment, please call your pharmacy*   Lab Work: none If you have labs (blood work) drawn today and your tests are completely normal, you will receive your results only by: Marland Kitchen MyChart Message (if you have MyChart) OR . A paper copy in the mail If you have any lab test that is abnormal or we need to change your treatment, we will call you to review the results.   Testing/Procedures: none   Follow-Up: At [facility], you and your health needs are our priority.  As part of our continuing mission to provide you with exceptional heart care, we have created designated Provider Care Teams.  These Care Teams include your primary Cardiologist (physician) and Advanced Practice Providers (APPs -  Physician Assistants and Nurse Practitioners) who all work together to provide you with the care you need, when you need it.  We recommend signing up for the patient portal called "MyChart".  Sign up information is provided on this After Visit Summary.  MyChart is used to connect with patients for Virtual Visits (Telemedicine).  Patients are able to view lab/test results, encounter notes, upcoming appointments, etc.  Non-urgent messages can be sent to your provider as well.   To learn more about what you can do with MyChart, go to [email or web address].    Your next appointment:   1 year(s)  The format for your next appointment:   In Person  Provider:   Otilio Saber, PA   Other Instructions Remote monitoring is used to monitor your Pacemaker  from home. This monitoring reduces the number of office visits required to check your device to one time per year. It allows [place] to keep an eye on the functioning of your device to ensure it is working properly. You are scheduled for a device check from home on 01/24/20. You may send your transmission at any time that day. If you have a wireless device, the  transmission will be sent automatically. After your physician reviews your transmission, you will receive a postcard with your next transmission date.  Take your BP Daily and report to PCP.  Also, have Doctor Fax Lab results to [phone]  Attention:  Mardelle Matte

## 2020-01-24 ENCOUNTER — Ambulatory Visit (INDEPENDENT_AMBULATORY_CARE_PROVIDER_SITE_OTHER): Payer: Medicare HMO | Admitting: *Deleted

## 2020-01-24 DIAGNOSIS — I495 Sick sinus syndrome: Secondary | ICD-10-CM | POA: Diagnosis not present

## 2020-01-27 LAB — CUP PACEART REMOTE DEVICE CHECK
Battery Remaining Longevity: 74 mo
Battery Voltage: 3.01 V
Brady Statistic AP VP Percent: 0.07 %
Brady Statistic AP VS Percent: 88.5 %
Brady Statistic AS VP Percent: 0.73 %
Brady Statistic AS VS Percent: 10.7 %
Brady Statistic RA Percent Paced: 86.25 %
Brady Statistic RV Percent Paced: 0.86 %
Date Time Interrogation Session: 20210528161228
Implantable Lead Implant Date: 20171006
Implantable Lead Implant Date: 20171006
Implantable Lead Location: 753859
Implantable Lead Location: 753860
Implantable Lead Model: 5076
Implantable Lead Model: 5076
Implantable Pulse Generator Implant Date: 20171006
Lead Channel Impedance Value: 342 Ohm
Lead Channel Impedance Value: 456 Ohm
Lead Channel Impedance Value: 494 Ohm
Lead Channel Impedance Value: 513 Ohm
Lead Channel Pacing Threshold Amplitude: 0.625 V
Lead Channel Pacing Threshold Amplitude: 0.875 V
Lead Channel Pacing Threshold Pulse Width: 0.4 ms
Lead Channel Pacing Threshold Pulse Width: 0.4 ms
Lead Channel Sensing Intrinsic Amplitude: 2.875 mV
Lead Channel Sensing Intrinsic Amplitude: 2.875 mV
Lead Channel Sensing Intrinsic Amplitude: 5.5 mV
Lead Channel Sensing Intrinsic Amplitude: 5.5 mV
Lead Channel Setting Pacing Amplitude: 2 V
Lead Channel Setting Pacing Amplitude: 2.5 V
Lead Channel Setting Pacing Pulse Width: 0.4 ms
Lead Channel Setting Sensing Sensitivity: 0.9 mV

## 2020-01-28 NOTE — Progress Notes (Signed)
Remote pacemaker transmission.   

## 2020-04-24 ENCOUNTER — Ambulatory Visit (INDEPENDENT_AMBULATORY_CARE_PROVIDER_SITE_OTHER): Payer: Medicare HMO | Admitting: *Deleted

## 2020-04-24 DIAGNOSIS — I495 Sick sinus syndrome: Secondary | ICD-10-CM

## 2020-04-24 LAB — CUP PACEART REMOTE DEVICE CHECK
Battery Remaining Longevity: 67 mo
Battery Voltage: 3 V
Brady Statistic AP VP Percent: 0.09 %
Brady Statistic AP VS Percent: 87.25 %
Brady Statistic AS VP Percent: 1.17 %
Brady Statistic AS VS Percent: 11.49 %
Brady Statistic RA Percent Paced: 84.75 %
Brady Statistic RV Percent Paced: 1.33 %
Date Time Interrogation Session: 20210827075959
Implantable Lead Implant Date: 20171006
Implantable Lead Implant Date: 20171006
Implantable Lead Location: 753859
Implantable Lead Location: 753860
Implantable Lead Model: 5076
Implantable Lead Model: 5076
Implantable Pulse Generator Implant Date: 20171006
Lead Channel Impedance Value: 323 Ohm
Lead Channel Impedance Value: 437 Ohm
Lead Channel Impedance Value: 475 Ohm
Lead Channel Impedance Value: 513 Ohm
Lead Channel Pacing Threshold Amplitude: 0.625 V
Lead Channel Pacing Threshold Amplitude: 0.875 V
Lead Channel Pacing Threshold Pulse Width: 0.4 ms
Lead Channel Pacing Threshold Pulse Width: 0.4 ms
Lead Channel Sensing Intrinsic Amplitude: 2.625 mV
Lead Channel Sensing Intrinsic Amplitude: 2.625 mV
Lead Channel Sensing Intrinsic Amplitude: 5.25 mV
Lead Channel Sensing Intrinsic Amplitude: 5.25 mV
Lead Channel Setting Pacing Amplitude: 2 V
Lead Channel Setting Pacing Amplitude: 2.5 V
Lead Channel Setting Pacing Pulse Width: 0.4 ms
Lead Channel Setting Sensing Sensitivity: 0.9 mV

## 2020-04-24 NOTE — Progress Notes (Signed)
Remote pacemaker transmission.   

## 2020-07-27 ENCOUNTER — Ambulatory Visit (INDEPENDENT_AMBULATORY_CARE_PROVIDER_SITE_OTHER): Payer: Medicare HMO

## 2020-07-27 DIAGNOSIS — I495 Sick sinus syndrome: Secondary | ICD-10-CM

## 2020-07-27 LAB — CUP PACEART REMOTE DEVICE CHECK
Battery Remaining Longevity: 65 mo
Battery Voltage: 3 V
Brady Statistic AP VP Percent: 0.12 %
Brady Statistic AP VS Percent: 88.89 %
Brady Statistic AS VP Percent: 0.85 %
Brady Statistic AS VS Percent: 10.14 %
Brady Statistic RA Percent Paced: 85.93 %
Brady Statistic RV Percent Paced: 1 %
Date Time Interrogation Session: 20211129082113
Implantable Lead Implant Date: 20171006
Implantable Lead Implant Date: 20171006
Implantable Lead Location: 753859
Implantable Lead Location: 753860
Implantable Lead Model: 5076
Implantable Lead Model: 5076
Implantable Pulse Generator Implant Date: 20171006
Lead Channel Impedance Value: 323 Ohm
Lead Channel Impedance Value: 437 Ohm
Lead Channel Impedance Value: 456 Ohm
Lead Channel Impedance Value: 475 Ohm
Lead Channel Pacing Threshold Amplitude: 0.625 V
Lead Channel Pacing Threshold Amplitude: 0.875 V
Lead Channel Pacing Threshold Pulse Width: 0.4 ms
Lead Channel Pacing Threshold Pulse Width: 0.4 ms
Lead Channel Sensing Intrinsic Amplitude: 2.75 mV
Lead Channel Sensing Intrinsic Amplitude: 2.75 mV
Lead Channel Sensing Intrinsic Amplitude: 6.375 mV
Lead Channel Sensing Intrinsic Amplitude: 6.375 mV
Lead Channel Setting Pacing Amplitude: 2 V
Lead Channel Setting Pacing Amplitude: 2.5 V
Lead Channel Setting Pacing Pulse Width: 0.4 ms
Lead Channel Setting Sensing Sensitivity: 0.9 mV

## 2020-08-03 NOTE — Progress Notes (Signed)
Remote pacemaker transmission.   

## 2020-10-26 ENCOUNTER — Ambulatory Visit (INDEPENDENT_AMBULATORY_CARE_PROVIDER_SITE_OTHER): Payer: Medicare HMO

## 2020-10-26 DIAGNOSIS — I495 Sick sinus syndrome: Secondary | ICD-10-CM | POA: Diagnosis not present

## 2020-10-28 LAB — CUP PACEART REMOTE DEVICE CHECK
Battery Remaining Longevity: 65 mo
Battery Voltage: 3 V
Brady Statistic AP VP Percent: 0.08 %
Brady Statistic AP VS Percent: 88.88 %
Brady Statistic AS VP Percent: 1.1 %
Brady Statistic AS VS Percent: 9.94 %
Brady Statistic RA Percent Paced: 85.98 %
Brady Statistic RV Percent Paced: 1.21 %
Date Time Interrogation Session: 20220228082506
Implantable Lead Implant Date: 20171006
Implantable Lead Implant Date: 20171006
Implantable Lead Location: 753859
Implantable Lead Location: 753860
Implantable Lead Model: 5076
Implantable Lead Model: 5076
Implantable Pulse Generator Implant Date: 20171006
Lead Channel Impedance Value: 342 Ohm
Lead Channel Impedance Value: 456 Ohm
Lead Channel Impedance Value: 456 Ohm
Lead Channel Impedance Value: 475 Ohm
Lead Channel Pacing Threshold Amplitude: 0.625 V
Lead Channel Pacing Threshold Amplitude: 1 V
Lead Channel Pacing Threshold Pulse Width: 0.4 ms
Lead Channel Pacing Threshold Pulse Width: 0.4 ms
Lead Channel Sensing Intrinsic Amplitude: 2.875 mV
Lead Channel Sensing Intrinsic Amplitude: 2.875 mV
Lead Channel Sensing Intrinsic Amplitude: 5.875 mV
Lead Channel Sensing Intrinsic Amplitude: 5.875 mV
Lead Channel Setting Pacing Amplitude: 2 V
Lead Channel Setting Pacing Amplitude: 2.5 V
Lead Channel Setting Pacing Pulse Width: 0.4 ms
Lead Channel Setting Sensing Sensitivity: 0.9 mV

## 2020-11-03 NOTE — Progress Notes (Signed)
Remote pacemaker transmission.   

## 2020-12-11 ENCOUNTER — Encounter: Payer: Medicare HMO | Admitting: Student

## 2021-01-08 ENCOUNTER — Encounter (INDEPENDENT_AMBULATORY_CARE_PROVIDER_SITE_OTHER): Payer: Self-pay

## 2021-01-08 ENCOUNTER — Ambulatory Visit: Payer: Medicare HMO | Admitting: Student

## 2021-01-08 ENCOUNTER — Other Ambulatory Visit: Payer: Self-pay

## 2021-01-08 ENCOUNTER — Encounter: Payer: Self-pay | Admitting: Student

## 2021-01-08 VITALS — BP 162/64 | HR 62 | Ht 67.0 in | Wt 149.2 lb

## 2021-01-08 DIAGNOSIS — I4891 Unspecified atrial fibrillation: Secondary | ICD-10-CM

## 2021-01-08 DIAGNOSIS — I495 Sick sinus syndrome: Secondary | ICD-10-CM

## 2021-01-08 DIAGNOSIS — I10 Essential (primary) hypertension: Secondary | ICD-10-CM

## 2021-01-08 LAB — CUP PACEART INCLINIC DEVICE CHECK
Battery Remaining Longevity: 63 mo
Battery Voltage: 3 V
Brady Statistic AP VP Percent: 0.1 %
Brady Statistic AP VS Percent: 88.69 %
Brady Statistic AS VP Percent: 0.98 %
Brady Statistic AS VS Percent: 10.23 %
Brady Statistic RA Percent Paced: 85.94 %
Brady Statistic RV Percent Paced: 1.13 %
Date Time Interrogation Session: 20220513100556
Implantable Lead Implant Date: 20171006
Implantable Lead Implant Date: 20171006
Implantable Lead Location: 753859
Implantable Lead Location: 753860
Implantable Lead Model: 5076
Implantable Lead Model: 5076
Implantable Pulse Generator Implant Date: 20171006
Lead Channel Impedance Value: 342 Ohm
Lead Channel Impedance Value: 475 Ohm
Lead Channel Impedance Value: 475 Ohm
Lead Channel Impedance Value: 513 Ohm
Lead Channel Pacing Threshold Amplitude: 1 V
Lead Channel Pacing Threshold Amplitude: 1 V
Lead Channel Pacing Threshold Pulse Width: 0.4 ms
Lead Channel Pacing Threshold Pulse Width: 0.4 ms
Lead Channel Sensing Intrinsic Amplitude: 3.25 mV
Lead Channel Sensing Intrinsic Amplitude: 7.25 mV
Lead Channel Setting Pacing Amplitude: 2 V
Lead Channel Setting Pacing Amplitude: 2.5 V
Lead Channel Setting Pacing Pulse Width: 0.4 ms
Lead Channel Setting Sensing Sensitivity: 0.9 mV

## 2021-01-08 NOTE — Progress Notes (Addendum)
Electrophysiology Office Note Date: 01/08/2021  ID:  Emily [name], DOB [birth year], MRN [id number]  PCP: Galvin Proffer, MD Primary Cardiologist: None Electrophysiologist: Hillis Range, MD   CC: Pacemaker follow-up  Emily [name] is a 80 y.o. female seen today for Hillis Range, MD for routine electrophysiology followup.  Since last being seen in our clinic the patient reports doing very well. She is able to push mow her yard without any difficulty.  she denies chest pain, palpitations, dyspnea, PND, orthopnea, nausea, vomiting, dizziness, syncope, weight gain, or early satiety. She has occasional peripheral edema, especially at the end of the day, but this does not seem significant or limiting to her.   Device History: Medtronic Dual Chamber PPM implanted 06/03/2016 for SSS/Tachy-brady   Past Medical History:  Diagnosis Date  . Atrial fibrillation ([facility])   . HTN (hypertension)    11/20/12 -ECHO  EF 55-60%  . PVC's (premature ventricular contractions)   . Right bundle branch block   . 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 EP IMPLANTABLE DEVICE N/A 06/03/2016   MDT Advisa MRI compatible 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  . TONSILLECTOMY      Current Outpatient Medications  Medication Sig Dispense Refill  . ALPRAZolam (XANAX) 1 MG tablet Take 1 mg by mouth as needed for anxiety.     Marland Kitchen aspirin EC 81 MG tablet Take 1 tablet (81 mg total) by mouth daily. 90 tablet 3  . atenolol (TENORMIN) 100 MG tablet Take 100 mg by mouth daily.    . B Complex Vitamins (VITAMIN-B COMPLEX) TABS Take 1 tablet by mouth daily as needed (takes occassionally for vitamin supplementation).     . Coenzyme Q10 (COQ10 PO) Take 1 tablet by mouth daily as needed (takes occassionally for vitamin supplementation).     Marland Kitchen diltiazem (CARDIZEM CD) 240 MG 24 hr capsule Take 1 capsule (240 mg total) by mouth daily. 90 capsule 2  .  diltiazem (CARDIZEM) 30 MG tablet Take 30 mg by mouth as directed. Take as needed for heart rate elevation    . lisinopril (ZESTRIL) 40 MG tablet Take 1 tablet (40 mg total) by mouth daily. 30 tablet 6   No current facility-administered medications for this visit.    Allergies:   Patient has no known allergies.   Social History: Social History   Socioeconomic History  . Marital status: Married    Spouse name: Not on file  . Number of children: 3  . Years of education: Not on file  . Highest education level: Not on file  Occupational History  . Occupation: theraputic alternatives  Tobacco Use  . Smoking status: Former Smoker    Quit date: 09/01/1976    Years since quitting: 44.3  . Smokeless tobacco: Never Used  Substance and Sexual Activity  . Alcohol use: Yes    Alcohol/week: 0.0 standard drinks    Comment: occassional  . Drug use: No  . Sexual activity: Not on file  Other Topics Concern  . Not on file  Social History Narrative   Lives in [place].  Cares for her husband who is disabled.   Retired from Actor of Corporate investment banker Strain: Not on file  Food Insecurity: Not on file  Transportation Needs: Not on file  Physical Activity: Not on file  Stress: Not on file  Social Connections: Not on file  Intimate Partner Violence: Not on file    Family History: Family History  Problem Relation Age of Onset  . Aneurysm Mother   . Heart disease Father      Review of Systems: All other systems reviewed and are otherwise negative except as noted above.  Physical Exam: Vitals:   01/08/21 0934  BP: (!) 162/64  Pulse: 62  SpO2: 97%  Weight: 149 lb 3.2 oz (67.7 kg)  Height: 5\' 7"  (1.702 m)     GEN- The patient is well appearing, alert and oriented x 3 today.   HEENT: normocephalic, atraumatic; sclera clear, conjunctiva pink; hearing intact; oropharynx clear; neck supple  Lungs- Clear to ausculation bilaterally, normal work of  breathing.  No wheezes, rales, rhonchi Heart- Regular rate and rhythm, no murmurs, rubs or gallops  GI- soft, non-tender, non-distended, bowel sounds present  Extremities- no clubbing or cyanosis. No edema MS- no significant deformity or atrophy Skin- warm and dry, no rash or lesion; PPM pocket well healed Psych- euthymic mood, full affect Neuro- strength and sensation are intact  PPM Interrogation- reviewed in detail today,  See PACEART report  EKG:  EKG is ordered today. The ekg ordered today shows Atrial pacing with NSR at 62 bpm  Recent Labs: No results found for requested labs within last 8760 hours.   Wt Readings from Last 3 Encounters:  01/08/21 149 lb 3.2 oz (67.7 kg)  12/27/19 135 lb 12.8 oz (61.6 kg)  04/04/18 146 lb (66.2 kg)     Other studies Reviewed: Additional studies/ records that were reviewed today include: Previous EP office notes, Previous remote checks, Most recent labwork.   Labs from PCP 12/31/20 TSH 3.33 Free, T3 3.4 Free, T4 1.13 Creatinine 0.96 K 4.2 Hgb 13.1 Tota Chol 148 TG 75 HDL 52 LDL 81  Assessment and Plan:  1. Sick sinus syndrome s/p Medtronic PPM  Normal PPM function See Pace Art report No changes today  2. Paroxysmal AF Burden 5.5%. Longest episode on todays monitor nearly 8 hours. Declines OAC despite CHA2DS2VASC of at least 5. Discussed at length again today the new OACs and their relative ease and safety.  She wishes to continue monitoring for now. She is aware if her burden/longest episodes increase we will need to strongly consider.  3. HTN Elevated on arrival. Runs 120-130s at home and other visits. She would prefer to follow for now.  Continue current medications  Current medicines are reviewed at length with the patient today.   The patient does not have concerns regarding her medicines.  The following changes were made today:  none  Labs/ tests ordered today include:  See above for recent labs via PCP.    Disposition:   Follow up with Dr. 03/02/21 in 12 Months    Signed, Johney Frame  01/08/2021 10:05 AM  [facility] [address] [place] [phone] (office) [phone] (fax)

## 2021-01-08 NOTE — Patient Instructions (Signed)
Medication Instructions:  Your physician recommends that you continue on your current medications as directed. Please refer to the Current Medication list given to you today.  *If you need a refill on your cardiac medications before your next appointment, please call your pharmacy*   Lab Work: None If you have labs (blood work) drawn today and your tests are completely normal, you will receive your results only by: . MyChart Message (if you have MyChart) OR . A paper copy in the mail If you have any lab test that is abnormal or we need to change your treatment, we will call you to review the results.   Follow-Up: At [facility], you and your health needs are our priority.  As part of our continuing mission to provide you with exceptional heart care, we have created designated Provider Care Teams.  These Care Teams include your primary Cardiologist (physician) and Advanced Practice Providers (APPs -  Physician Assistants and Nurse Practitioners) who all work together to provide you with the care you need, when you need it.  We recommend signing up for the patient portal called "MyChart".  Sign up information is provided on this After Visit Summary.  MyChart is used to connect with patients for Virtual Visits (Telemedicine).  Patients are able to view lab/test results, encounter notes, upcoming appointments, etc.  Non-urgent messages can be sent to your provider as well.   To learn more about what you can do with MyChart, go to [email or web address].    Your next appointment:   1 year(s)  The format for your next appointment:   In Person  Provider:   You may see James Allred, MD or one of the following Advanced Practice Providers on your designated Care Team:    Amber Seiler, NP  Renee Ursuy, PA-C  Michael "Andy" Tillery, PA-C    

## 2021-01-25 LAB — CUP PACEART REMOTE DEVICE CHECK
Battery Remaining Longevity: 57 mo
Battery Voltage: 2.99 V
Brady Statistic AP VP Percent: 0.15 %
Brady Statistic AP VS Percent: 88.25 %
Brady Statistic AS VP Percent: 1.57 %
Brady Statistic AS VS Percent: 10.03 %
Brady Statistic RA Percent Paced: 83.95 %
Brady Statistic RV Percent Paced: 1.77 %
Date Time Interrogation Session: 20220530084600
Implantable Lead Implant Date: 20171006
Implantable Lead Implant Date: 20171006
Implantable Lead Location: 753859
Implantable Lead Location: 753860
Implantable Lead Model: 5076
Implantable Lead Model: 5076
Implantable Pulse Generator Implant Date: 20171006
Lead Channel Impedance Value: 323 Ohm
Lead Channel Impedance Value: 437 Ohm
Lead Channel Impedance Value: 437 Ohm
Lead Channel Impedance Value: 494 Ohm
Lead Channel Pacing Threshold Amplitude: 0.75 V
Lead Channel Pacing Threshold Amplitude: 1 V
Lead Channel Pacing Threshold Pulse Width: 0.4 ms
Lead Channel Pacing Threshold Pulse Width: 0.4 ms
Lead Channel Sensing Intrinsic Amplitude: 2.75 mV
Lead Channel Sensing Intrinsic Amplitude: 2.75 mV
Lead Channel Sensing Intrinsic Amplitude: 4.75 mV
Lead Channel Sensing Intrinsic Amplitude: 4.75 mV
Lead Channel Setting Pacing Amplitude: 2 V
Lead Channel Setting Pacing Amplitude: 2.5 V
Lead Channel Setting Pacing Pulse Width: 0.4 ms
Lead Channel Setting Sensing Sensitivity: 0.9 mV

## 2021-01-26 ENCOUNTER — Ambulatory Visit (INDEPENDENT_AMBULATORY_CARE_PROVIDER_SITE_OTHER): Payer: Medicare HMO

## 2021-01-26 DIAGNOSIS — I495 Sick sinus syndrome: Secondary | ICD-10-CM

## 2021-02-18 NOTE — Progress Notes (Signed)
Remote pacemaker transmission.   

## 2021-03-25 ENCOUNTER — Emergency Department (HOSPITAL_COMMUNITY): Payer: Medicare HMO

## 2021-03-25 ENCOUNTER — Inpatient Hospital Stay (HOSPITAL_COMMUNITY)
Admission: EM | Admit: 2021-03-25 | Discharge: 2021-03-27 | DRG: 183 | Discharge status: Against Medical Advice | Payer: Medicare HMO | Attending: General Surgery | Admitting: General Surgery

## 2021-03-25 ENCOUNTER — Encounter (HOSPITAL_COMMUNITY): Payer: Self-pay

## 2021-03-25 ENCOUNTER — Other Ambulatory Visit: Payer: Self-pay

## 2021-03-25 DIAGNOSIS — S36039A Unspecified laceration of spleen, initial encounter: Principal | ICD-10-CM

## 2021-03-25 DIAGNOSIS — S2242XA Multiple fractures of ribs, left side, initial encounter for closed fracture: Secondary | ICD-10-CM

## 2021-03-25 DIAGNOSIS — T1490XA Injury, unspecified, initial encounter: Secondary | ICD-10-CM

## 2021-03-25 DIAGNOSIS — Z20822 Contact with and (suspected) exposure to covid-19: Secondary | ICD-10-CM | POA: Diagnosis present

## 2021-03-25 DIAGNOSIS — Z5329 Procedure and treatment not carried out because of patient's decision for other reasons: Secondary | ICD-10-CM | POA: Diagnosis not present

## 2021-03-25 DIAGNOSIS — Z7982 Long term (current) use of aspirin: Secondary | ICD-10-CM

## 2021-03-25 DIAGNOSIS — Z8616 Personal history of COVID-19: Secondary | ICD-10-CM | POA: Diagnosis not present

## 2021-03-25 DIAGNOSIS — Z79899 Other long term (current) drug therapy: Secondary | ICD-10-CM | POA: Diagnosis not present

## 2021-03-25 DIAGNOSIS — Z950 Presence of cardiac pacemaker: Secondary | ICD-10-CM | POA: Diagnosis not present

## 2021-03-25 DIAGNOSIS — Y92410 Unspecified street and highway as the place of occurrence of the external cause: Secondary | ICD-10-CM

## 2021-03-25 DIAGNOSIS — S36031A Moderate laceration of spleen, initial encounter: Secondary | ICD-10-CM | POA: Diagnosis present

## 2021-03-25 DIAGNOSIS — Z87440 Personal history of urinary (tract) infections: Secondary | ICD-10-CM | POA: Diagnosis not present

## 2021-03-25 DIAGNOSIS — I4891 Unspecified atrial fibrillation: Secondary | ICD-10-CM | POA: Diagnosis present

## 2021-03-25 DIAGNOSIS — S2232XA Fracture of one rib, left side, initial encounter for closed fracture: Secondary | ICD-10-CM

## 2021-03-25 DIAGNOSIS — I10 Essential (primary) hypertension: Secondary | ICD-10-CM | POA: Diagnosis present

## 2021-03-25 DIAGNOSIS — J9811 Atelectasis: Secondary | ICD-10-CM | POA: Diagnosis not present

## 2021-03-25 DIAGNOSIS — R079 Chest pain, unspecified: Secondary | ICD-10-CM | POA: Diagnosis present

## 2021-03-25 DIAGNOSIS — I495 Sick sinus syndrome: Secondary | ICD-10-CM | POA: Diagnosis present

## 2021-03-25 HISTORY — DX: Presence of cardiac pacemaker: Z95.0

## 2021-03-25 HISTORY — DX: Unspecified atrial fibrillation: I48.91

## 2021-03-25 LAB — URINALYSIS, ROUTINE W REFLEX MICROSCOPIC
Bilirubin Urine: NEGATIVE
Glucose, UA: NEGATIVE mg/dL
Ketones, ur: NEGATIVE mg/dL
Nitrite: NEGATIVE
Protein, ur: NEGATIVE mg/dL
Specific Gravity, Urine: 1.045 — ABNORMAL HIGH (ref 1.005–1.030)
pH: 5 (ref 5.0–8.0)

## 2021-03-25 LAB — COMPREHENSIVE METABOLIC PANEL
ALT: 38 U/L (ref 0–44)
AST: 65 U/L — ABNORMAL HIGH (ref 15–41)
Albumin: 3.9 g/dL (ref 3.5–5.0)
Alkaline Phosphatase: 58 U/L (ref 38–126)
Anion gap: 10 (ref 5–15)
BUN: 32 mg/dL — ABNORMAL HIGH (ref 8–23)
CO2: 21 mmol/L — ABNORMAL LOW (ref 22–32)
Calcium: 9.3 mg/dL (ref 8.9–10.3)
Chloride: 108 mmol/L (ref 98–111)
Creatinine, Ser: 1.23 mg/dL — ABNORMAL HIGH (ref 0.44–1.00)
GFR, Estimated: 44 mL/min — ABNORMAL LOW (ref >60)
Glucose, Bld: 130 mg/dL — ABNORMAL HIGH (ref 70–99)
Potassium: 3.8 mmol/L (ref 3.5–5.1)
Sodium: 139 mmol/L (ref 135–145)
Total Bilirubin: 0.7 mg/dL (ref 0.3–1.2)
Total Protein: 6.8 g/dL (ref 6.5–8.1)

## 2021-03-25 LAB — I-STAT CHEM 8, ED
BUN: 36 mg/dL — ABNORMAL HIGH (ref 8–23)
Calcium, Ion: 1.15 mmol/L (ref 1.15–1.40)
Chloride: 112 mmol/L — ABNORMAL HIGH (ref 98–111)
Creatinine, Ser: 1.2 mg/dL — ABNORMAL HIGH (ref 0.44–1.00)
Glucose, Bld: 126 mg/dL — ABNORMAL HIGH (ref 70–99)
HCT: 39 % (ref 36.0–46.0)
Hemoglobin: 13.3 g/dL (ref 12.0–15.0)
Potassium: 3.7 mmol/L (ref 3.5–5.1)
Sodium: 143 mmol/L (ref 135–145)
TCO2: 23 mmol/L (ref 22–32)

## 2021-03-25 LAB — CBC
HCT: 35.9 % — ABNORMAL LOW (ref 36.0–46.0)
HCT: 36.4 % (ref 36.0–46.0)
HCT: 40.5 % (ref 36.0–46.0)
Hemoglobin: 11.3 g/dL — ABNORMAL LOW (ref 12.0–15.0)
Hemoglobin: 11.7 g/dL — ABNORMAL LOW (ref 12.0–15.0)
Hemoglobin: 13 g/dL (ref 12.0–15.0)
MCH: 30.3 pg (ref 26.0–34.0)
MCH: 30.7 pg (ref 26.0–34.0)
MCH: 30.8 pg (ref 26.0–34.0)
MCHC: 31.5 g/dL (ref 30.0–36.0)
MCHC: 32.1 g/dL (ref 30.0–36.0)
MCHC: 32.1 g/dL (ref 30.0–36.0)
MCV: 95.5 fL (ref 80.0–100.0)
MCV: 95.8 fL (ref 80.0–100.0)
MCV: 96.2 fL (ref 80.0–100.0)
Platelets: 159 10*3/uL (ref 150–400)
Platelets: 190 10*3/uL (ref 150–400)
Platelets: 202 10*3/uL (ref 150–400)
RBC: 3.73 MIL/uL — ABNORMAL LOW (ref 3.87–5.11)
RBC: 3.8 MIL/uL — ABNORMAL LOW (ref 3.87–5.11)
RBC: 4.24 MIL/uL (ref 3.87–5.11)
RDW: 13.1 % (ref 11.5–15.5)
RDW: 13.1 % (ref 11.5–15.5)
RDW: 13.2 % (ref 11.5–15.5)
WBC: 15.4 10*3/uL — ABNORMAL HIGH (ref 4.0–10.5)
WBC: 8.2 10*3/uL (ref 4.0–10.5)
WBC: 8.8 10*3/uL (ref 4.0–10.5)
nRBC: 0 % (ref 0.0–0.2)
nRBC: 0 % (ref 0.0–0.2)
nRBC: 0 % (ref 0.0–0.2)

## 2021-03-25 LAB — BASIC METABOLIC PANEL
Anion gap: 6 (ref 5–15)
BUN: 27 mg/dL — ABNORMAL HIGH (ref 8–23)
CO2: 21 mmol/L — ABNORMAL LOW (ref 22–32)
Calcium: 8.6 mg/dL — ABNORMAL LOW (ref 8.9–10.3)
Chloride: 112 mmol/L — ABNORMAL HIGH (ref 98–111)
Creatinine, Ser: 1.05 mg/dL — ABNORMAL HIGH (ref 0.44–1.00)
GFR, Estimated: 54 mL/min — ABNORMAL LOW (ref >60)
Glucose, Bld: 130 mg/dL — ABNORMAL HIGH (ref 70–99)
Potassium: 3.6 mmol/L (ref 3.5–5.1)
Sodium: 139 mmol/L (ref 135–145)

## 2021-03-25 LAB — PROTIME-INR
INR: 1.1 (ref 0.8–1.2)
Prothrombin Time: 14.5 seconds (ref 11.4–15.2)

## 2021-03-25 LAB — RESP PANEL BY RT-PCR (FLU A&B, COVID) ARPGX2
Influenza A by PCR: NEGATIVE
Influenza B by PCR: NEGATIVE
SARS Coronavirus 2 by RT PCR: NEGATIVE

## 2021-03-25 LAB — SAMPLE TO BLOOD BANK

## 2021-03-25 LAB — ETHANOL: Alcohol, Ethyl (B): <10 mg/dL (ref <10)

## 2021-03-25 LAB — LACTIC ACID, PLASMA: Lactic Acid, Venous: 0.9 mmol/L (ref 0.5–1.9)

## 2021-03-25 MED ORDER — DILTIAZEM HCL ER COATED BEADS 120 MG PO CP24
240.0000 mg | ORAL_CAPSULE | Freq: Every day | ORAL | Status: DC
  Administered 2021-03-25 – 2021-03-27 (×3): 240 mg via ORAL
  Filled 2021-03-25 (×3): qty 2
  Filled 2021-03-25: qty 1

## 2021-03-25 MED ORDER — SODIUM CHLORIDE 0.9 % IV SOLN: Freq: Once | INTRAVENOUS | Status: AC

## 2021-03-25 MED ORDER — MORPHINE SULFATE (PF) 2 MG/ML IV SOLN: 1.0000 mg | INTRAVENOUS | Status: DC | PRN

## 2021-03-25 MED ORDER — OXYCODONE HCL 5 MG PO TABS
5.0000 mg | ORAL_TABLET | ORAL | Status: DC | PRN
  Administered 2021-03-25: 5 mg via ORAL
  Filled 2021-03-25: qty 1

## 2021-03-25 MED ORDER — BISACODYL 10 MG RE SUPP: 10.0000 mg | Freq: Every day | RECTAL | Status: DC | PRN

## 2021-03-25 MED ORDER — ACETAMINOPHEN 500 MG PO TABS
1000.0000 mg | ORAL_TABLET | Freq: Four times a day (QID) | ORAL | Status: DC
  Administered 2021-03-25 (×3): 1000 mg via ORAL
  Filled 2021-03-25 (×5): qty 2

## 2021-03-25 MED ORDER — SODIUM CHLORIDE 0.9 % IV SOLN
INTRAVENOUS | Status: AC | PRN
  Administered 2021-03-25: 1000 mL via INTRAVENOUS

## 2021-03-25 MED ORDER — CYCLOBENZAPRINE HCL 10 MG PO TABS
5.0000 mg | ORAL_TABLET | Freq: Three times a day (TID) | ORAL | Status: DC
  Administered 2021-03-25 – 2021-03-27 (×5): 5 mg via ORAL
  Filled 2021-03-25 (×7): qty 1

## 2021-03-25 MED ORDER — ATENOLOL 25 MG PO TABS
100.0000 mg | ORAL_TABLET | Freq: Every day | ORAL | Status: DC
  Administered 2021-03-25 – 2021-03-27 (×3): 100 mg via ORAL
  Filled 2021-03-25 (×3): qty 4

## 2021-03-25 MED ORDER — DOCUSATE SODIUM 100 MG PO CAPS
100.0000 mg | ORAL_CAPSULE | Freq: Two times a day (BID) | ORAL | Status: DC
  Administered 2021-03-26: 100 mg via ORAL
  Filled 2021-03-25 (×4): qty 1

## 2021-03-25 MED ORDER — IOHEXOL 300 MG/ML  SOLN
100.0000 mL | Freq: Once | INTRAMUSCULAR | Status: AC | PRN
  Administered 2021-03-25: 100 mL via INTRAVENOUS

## 2021-03-25 MED ORDER — SODIUM CHLORIDE 0.9 % IV SOLN: INTRAVENOUS | Status: DC

## 2021-03-25 MED ORDER — METOPROLOL TARTRATE 5 MG/5ML IV SOLN
5.0000 mg | Freq: Four times a day (QID) | INTRAVENOUS | Status: DC | PRN
  Administered 2021-03-27: 5 mg via INTRAVENOUS
  Filled 2021-03-25: qty 5

## 2021-03-25 MED ORDER — ONDANSETRON HCL 4 MG/2ML IJ SOLN: 4.0000 mg | Freq: Four times a day (QID) | INTRAMUSCULAR | Status: DC | PRN

## 2021-03-25 MED ORDER — SODIUM CHLORIDE 0.9 % IV BOLUS
500.0000 mL | Freq: Once | INTRAVENOUS | Status: AC
  Administered 2021-03-25: 500 mL via INTRAVENOUS

## 2021-03-25 MED ORDER — ENOXAPARIN SODIUM 30 MG/0.3ML IJ SOSY: 30.0000 mg | PREFILLED_SYRINGE | Freq: Two times a day (BID) | INTRAMUSCULAR | Status: DC

## 2021-03-25 MED ORDER — ONDANSETRON 4 MG PO TBDP: 4.0000 mg | ORAL_TABLET | Freq: Four times a day (QID) | ORAL | Status: DC | PRN
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
## 2021-03-25 NOTE — ED Notes (Signed)
Return from ct.
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
## 2021-03-25 NOTE — ED Notes (Signed)
Violeta Gelinas, MD cleared patient to stand and use the bedside commode.

## 2021-03-25 NOTE — ED Notes (Signed)
Pt to ct 

## 2021-03-25 NOTE — ED Notes (Signed)
Emily [name] daughter [phone] would like an update on the patient

## 2021-03-25 NOTE — ED Notes (Signed)
Patient is resting comfortably. 
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
## 2021-03-25 NOTE — TOC CAGE-AID Note (Signed)
Transition of Care [facility]) - CAGE-AID Screening   Patient Details  Name: Emily [name] MRN: [id number] Date of Birth: April 19, 1941  Transition of Care [facility]) CM/SW Contact:    Lossie Faes Tarpley-Carter, LCSWA Phone Number: 03/25/2021, 12:32 PM   Clinical Narrative: Pt participated in Cage-Aid.  Pt stated she does not use substance or ETOH.  Pt was not offered resources, due to no substance or ETOH use.    Koralyn Prestage Tarpley-Carter, MSW, LCSW-A Pronouns:  She/Her/Hers [facility]itions of Care Clinical Social Worker Direct Number:  [phone] [email or web address]   CAGE-AID Screening:    Have You Ever Felt You Ought to Cut Down on Your Drinking or Drug Use?: No Have People Annoyed You By [employer] Your Drinking Or Drug Use?: No Have You Felt Bad Or Guilty About Your Drinking Or Drug Use?: No Have You Ever Had a Drink or Used Drugs First Thing In The Morning to Steady Your Nerves or to Get Rid of a Hangover?: No CAGE-AID Score: 0  Substance Abuse Education Offered: No

## 2021-03-25 NOTE — ED Notes (Signed)
Night shift is "too busy" to take report at this time

## 2021-03-25 NOTE — ED Notes (Addendum)
Day shift said that they will not be taking report on this patient.

## 2021-03-25 NOTE — ED Notes (Signed)
Pt sleeping. Incentive spirometer at bedside
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
## 2021-03-26 ENCOUNTER — Inpatient Hospital Stay (HOSPITAL_COMMUNITY): Payer: Medicare HMO

## 2021-03-26 LAB — CBC
HCT: 37.6 % (ref 36.0–46.0)
Hemoglobin: 11.7 g/dL — ABNORMAL LOW (ref 12.0–15.0)
MCH: 30.2 pg (ref 26.0–34.0)
MCHC: 31.1 g/dL (ref 30.0–36.0)
MCV: 96.9 fL (ref 80.0–100.0)
Platelets: 157 10*3/uL (ref 150–400)
RBC: 3.88 MIL/uL (ref 3.87–5.11)
RDW: 13.2 % (ref 11.5–15.5)
WBC: 7.8 10*3/uL (ref 4.0–10.5)
nRBC: 0 % (ref 0.0–0.2)

## 2021-03-26 MED ORDER — ENSURE ENLIVE PO LIQD
237.0000 mL | Freq: Two times a day (BID) | ORAL | Status: DC
  Administered 2021-03-26: 237 mL via ORAL
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
## 2021-03-26 NOTE — Plan of Care (Signed)
  Problem: Clinical Measurements: Goal: Will remain free from infection Outcome: Progressing   Problem: Activity: Goal: Risk for activity intolerance will decrease Outcome: Progressing   
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
## 2021-03-27 MED ORDER — METOPROLOL TARTRATE 5 MG/5ML IV SOLN
5.0000 mg | Freq: Once | INTRAVENOUS | Status: AC
  Filled 2021-03-27: qty 5

## 2021-03-27 MED ORDER — METOPROLOL TARTRATE 5 MG/5ML IV SOLN
5.0000 mg | Freq: Once | INTRAVENOUS | Status: AC
  Administered 2021-03-27: 5 mg via INTRAVENOUS

## 2021-03-27 NOTE — Progress Notes (Signed)
Received consult for atrial fibrillation, came to see the patient however per chart and nursing staff, as well as surgical team patient had left AMA.    Rayford Halsted, PA-C 03/27/2021, 1:53 PM Office: [phone]
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
## 2021-03-27 NOTE — Progress Notes (Signed)
Pt converted from NSR-A-fib, HR130-140 on tele monitor, pt denies pain or discomfort, pt does have a history of A-Fib

## 2021-03-27 NOTE — Progress Notes (Signed)
Md notified regarding pt elevated HR order to give 5 lopressor iv x 1. Order initiated, pt remain alert and verbally responsive , denies pain or discomfort.

## 2021-03-29 ENCOUNTER — Encounter: Payer: Self-pay | Admitting: Student
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
## 2021-04-26 ENCOUNTER — Ambulatory Visit (INDEPENDENT_AMBULATORY_CARE_PROVIDER_SITE_OTHER): Payer: Medicare HMO

## 2021-04-26 DIAGNOSIS — I495 Sick sinus syndrome: Secondary | ICD-10-CM

## 2021-04-28 LAB — CUP PACEART REMOTE DEVICE CHECK
Battery Remaining Longevity: 64 mo
Battery Voltage: 2.99 V
Brady Statistic AP VP Percent: 0.08 %
Brady Statistic AP VS Percent: 54.05 %
Brady Statistic AS VP Percent: 4.03 %
Brady Statistic AS VS Percent: 41.83 %
Brady Statistic RA Percent Paced: 45.74 %
Brady Statistic RV Percent Paced: 3.97 %
Date Time Interrogation Session: 20220830105924
Implantable Lead Implant Date: 20171006
Implantable Lead Implant Date: 20171006
Implantable Lead Location: 753859
Implantable Lead Location: 753860
Implantable Lead Model: 5076
Implantable Lead Model: 5076
Implantable Pulse Generator Implant Date: 20171006
Lead Channel Impedance Value: 323 Ohm
Lead Channel Impedance Value: 437 Ohm
Lead Channel Impedance Value: 475 Ohm
Lead Channel Impedance Value: 475 Ohm
Lead Channel Pacing Threshold Amplitude: 0.75 V
Lead Channel Pacing Threshold Amplitude: 1 V
Lead Channel Pacing Threshold Pulse Width: 0.4 ms
Lead Channel Pacing Threshold Pulse Width: 0.4 ms
Lead Channel Sensing Intrinsic Amplitude: 2.625 mV
Lead Channel Sensing Intrinsic Amplitude: 2.625 mV
Lead Channel Sensing Intrinsic Amplitude: 5.625 mV
Lead Channel Sensing Intrinsic Amplitude: 5.625 mV
Lead Channel Setting Pacing Amplitude: 2 V
Lead Channel Setting Pacing Amplitude: 2.5 V
Lead Channel Setting Pacing Pulse Width: 0.4 ms
Lead Channel Setting Sensing Sensitivity: 0.9 mV

## 2021-05-07 NOTE — Progress Notes (Signed)
Remote pacemaker transmission.   

## 2021-07-26 ENCOUNTER — Ambulatory Visit (INDEPENDENT_AMBULATORY_CARE_PROVIDER_SITE_OTHER): Payer: Medicare HMO

## 2021-07-26 DIAGNOSIS — I495 Sick sinus syndrome: Secondary | ICD-10-CM | POA: Diagnosis not present

## 2021-07-28 LAB — CUP PACEART REMOTE DEVICE CHECK
Battery Remaining Longevity: 53 mo
Battery Voltage: 2.99 V
Brady Statistic AP VP Percent: 0.09 %
Brady Statistic AP VS Percent: 86.25 %
Brady Statistic AS VP Percent: 0.9 %
Brady Statistic AS VS Percent: 12.76 %
Brady Statistic RA Percent Paced: 83.25 %
Brady Statistic RV Percent Paced: 0.99 %
Date Time Interrogation Session: 20221130083229
Implantable Lead Implant Date: 20171006
Implantable Lead Implant Date: 20171006
Implantable Lead Location: 753859
Implantable Lead Location: 753860
Implantable Lead Model: 5076
Implantable Lead Model: 5076
Implantable Pulse Generator Implant Date: 20171006
Lead Channel Impedance Value: 323 Ohm
Lead Channel Impedance Value: 437 Ohm
Lead Channel Impedance Value: 456 Ohm
Lead Channel Impedance Value: 475 Ohm
Lead Channel Pacing Threshold Amplitude: 0.625 V
Lead Channel Pacing Threshold Amplitude: 0.875 V
Lead Channel Pacing Threshold Pulse Width: 0.4 ms
Lead Channel Pacing Threshold Pulse Width: 0.4 ms
Lead Channel Sensing Intrinsic Amplitude: 2.875 mV
Lead Channel Sensing Intrinsic Amplitude: 2.875 mV
Lead Channel Sensing Intrinsic Amplitude: 4.875 mV
Lead Channel Sensing Intrinsic Amplitude: 4.875 mV
Lead Channel Setting Pacing Amplitude: 2 V
Lead Channel Setting Pacing Amplitude: 2.5 V
Lead Channel Setting Pacing Pulse Width: 0.4 ms
Lead Channel Setting Sensing Sensitivity: 0.9 mV

## 2021-08-04 NOTE — Progress Notes (Signed)
Remote pacemaker transmission.   

## 2021-10-25 ENCOUNTER — Ambulatory Visit (INDEPENDENT_AMBULATORY_CARE_PROVIDER_SITE_OTHER): Payer: Medicare HMO

## 2021-10-25 DIAGNOSIS — I495 Sick sinus syndrome: Secondary | ICD-10-CM | POA: Diagnosis not present

## 2021-11-01 ENCOUNTER — Telehealth: Payer: Self-pay

## 2021-11-01 LAB — CUP PACEART REMOTE DEVICE CHECK
Battery Remaining Longevity: 50 mo
Battery Voltage: 2.99 V
Brady Statistic AP VP Percent: 0.11 %
Brady Statistic AP VS Percent: 78.83 %
Brady Statistic AS VP Percent: 2.47 %
Brady Statistic AS VS Percent: 18.59 %
Brady Statistic RA Percent Paced: 73.52 %
Brady Statistic RV Percent Paced: 2.71 %
Date Time Interrogation Session: 20230303090935
Implantable Lead Implant Date: 20171006
Implantable Lead Implant Date: 20171006
Implantable Lead Location: 753859
Implantable Lead Location: 753860
Implantable Lead Model: 5076
Implantable Lead Model: 5076
Implantable Pulse Generator Implant Date: 20171006
Lead Channel Impedance Value: 323 Ohm
Lead Channel Impedance Value: 418 Ohm
Lead Channel Impedance Value: 456 Ohm
Lead Channel Impedance Value: 475 Ohm
Lead Channel Pacing Threshold Amplitude: 0.5 V
Lead Channel Pacing Threshold Amplitude: 0.875 V
Lead Channel Pacing Threshold Pulse Width: 0.4 ms
Lead Channel Pacing Threshold Pulse Width: 0.4 ms
Lead Channel Sensing Intrinsic Amplitude: 2.75 mV
Lead Channel Sensing Intrinsic Amplitude: 2.75 mV
Lead Channel Sensing Intrinsic Amplitude: 5.75 mV
Lead Channel Sensing Intrinsic Amplitude: 5.75 mV
Lead Channel Setting Pacing Amplitude: 2 V
Lead Channel Setting Pacing Amplitude: 2.5 V
Lead Channel Setting Pacing Pulse Width: 0.4 ms
Lead Channel Setting Sensing Sensitivity: 0.9 mV

## 2021-11-01 NOTE — Progress Notes (Signed)
Remote pacemaker transmission.   

## 2021-11-01 NOTE — Telephone Encounter (Signed)
Manual transmission received. Presenting rhythm AP/VS 73. Appears episode that was ongoing during transmission 10/29/21 lasted 1 min 2 sec. Continues to decline OAC. States "this fast afib is normal for me and doesn't last long" Will continue to monitor. ? ? ? ? ? ? ? ? ? ?
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
## 2021-12-10 ENCOUNTER — Ambulatory Visit (INDEPENDENT_AMBULATORY_CARE_PROVIDER_SITE_OTHER): Payer: Worker's Compensation

## 2021-12-10 ENCOUNTER — Encounter: Payer: Self-pay | Admitting: Orthopaedic Surgery

## 2021-12-10 ENCOUNTER — Ambulatory Visit (INDEPENDENT_AMBULATORY_CARE_PROVIDER_SITE_OTHER): Payer: Worker's Compensation | Admitting: Orthopaedic Surgery

## 2021-12-10 VITALS — BP 162/72 | Ht 67.0 in | Wt 140.0 lb

## 2021-12-10 DIAGNOSIS — M25512 Pain in left shoulder: Secondary | ICD-10-CM

## 2021-12-10 DIAGNOSIS — S42202A Unspecified fracture of upper end of left humerus, initial encounter for closed fracture: Secondary | ICD-10-CM | POA: Insufficient documentation

## 2021-12-10 HISTORY — DX: Unspecified fracture of upper end of left humerus, initial encounter for closed fracture: S42.202A

## 2021-12-10 MED ORDER — HYDROCODONE-ACETAMINOPHEN 5-325 MG PO TABS
1.0000 | ORAL_TABLET | Freq: Four times a day (QID) | ORAL | 0 refills | Status: DC | PRN
Start: 2021-12-10 — End: 2022-04-29
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
## 2021-12-29 ENCOUNTER — Ambulatory Visit: Payer: Medicare HMO | Admitting: Orthopaedic Surgery

## 2022-01-04 ENCOUNTER — Encounter: Payer: Self-pay | Admitting: Orthopaedic Surgery

## 2022-01-04 ENCOUNTER — Ambulatory Visit (INDEPENDENT_AMBULATORY_CARE_PROVIDER_SITE_OTHER): Payer: Worker's Compensation | Admitting: Orthopaedic Surgery

## 2022-01-04 ENCOUNTER — Ambulatory Visit (INDEPENDENT_AMBULATORY_CARE_PROVIDER_SITE_OTHER): Payer: Worker's Compensation

## 2022-01-04 VITALS — BP 161/66 | Ht 67.0 in | Wt 140.0 lb

## 2022-01-04 DIAGNOSIS — S42202A Unspecified fracture of upper end of left humerus, initial encounter for closed fracture: Secondary | ICD-10-CM | POA: Diagnosis not present
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
## 2022-01-25 ENCOUNTER — Ambulatory Visit (INDEPENDENT_AMBULATORY_CARE_PROVIDER_SITE_OTHER): Payer: Medicare HMO

## 2022-01-25 DIAGNOSIS — I495 Sick sinus syndrome: Secondary | ICD-10-CM

## 2022-01-26 ENCOUNTER — Ambulatory Visit (INDEPENDENT_AMBULATORY_CARE_PROVIDER_SITE_OTHER): Payer: Worker's Compensation | Admitting: Orthopaedic Surgery

## 2022-01-26 ENCOUNTER — Ambulatory Visit (INDEPENDENT_AMBULATORY_CARE_PROVIDER_SITE_OTHER): Payer: Worker's Compensation

## 2022-01-26 VITALS — BP 170/76 | HR 73

## 2022-01-26 DIAGNOSIS — G8929 Other chronic pain: Secondary | ICD-10-CM

## 2022-01-26 DIAGNOSIS — M25512 Pain in left shoulder: Secondary | ICD-10-CM | POA: Diagnosis not present

## 2022-01-26 LAB — CUP PACEART REMOTE DEVICE CHECK
Battery Remaining Longevity: 45 mo
Battery Voltage: 2.98 V
Brady Statistic AP VP Percent: 0.12 %
Brady Statistic AP VS Percent: 73.14 %
Brady Statistic AS VP Percent: 3.11 %
Brady Statistic AS VS Percent: 23.62 %
Brady Statistic RA Percent Paced: 65.89 %
Brady Statistic RV Percent Paced: 3.34 %
Date Time Interrogation Session: 20230529111539
Implantable Lead Implant Date: 20171006
Implantable Lead Implant Date: 20171006
Implantable Lead Location: 753859
Implantable Lead Location: 753860
Implantable Lead Model: 5076
Implantable Lead Model: 5076
Implantable Pulse Generator Implant Date: 20171006
Lead Channel Impedance Value: 342 Ohm
Lead Channel Impedance Value: 418 Ohm
Lead Channel Impedance Value: 456 Ohm
Lead Channel Impedance Value: 475 Ohm
Lead Channel Pacing Threshold Amplitude: 0.625 V
Lead Channel Pacing Threshold Amplitude: 0.875 V
Lead Channel Pacing Threshold Pulse Width: 0.4 ms
Lead Channel Pacing Threshold Pulse Width: 0.4 ms
Lead Channel Sensing Intrinsic Amplitude: 3.25 mV
Lead Channel Sensing Intrinsic Amplitude: 3.25 mV
Lead Channel Sensing Intrinsic Amplitude: 5.625 mV
Lead Channel Sensing Intrinsic Amplitude: 5.625 mV
Lead Channel Setting Pacing Amplitude: 2 V
Lead Channel Setting Pacing Amplitude: 2.5 V
Lead Channel Setting Pacing Pulse Width: 0.4 ms
Lead Channel Setting Sensing Sensitivity: 0.9 mV
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
## 2022-02-10 NOTE — Progress Notes (Signed)
Remote pacemaker transmission.   

## 2022-02-16 ENCOUNTER — Ambulatory Visit (INDEPENDENT_AMBULATORY_CARE_PROVIDER_SITE_OTHER): Payer: Worker's Compensation

## 2022-02-16 ENCOUNTER — Ambulatory Visit (INDEPENDENT_AMBULATORY_CARE_PROVIDER_SITE_OTHER): Payer: Worker's Compensation | Admitting: Orthopaedic Surgery

## 2022-02-16 VITALS — BP 115/69 | HR 72

## 2022-02-16 DIAGNOSIS — G8929 Other chronic pain: Secondary | ICD-10-CM | POA: Diagnosis not present

## 2022-02-16 DIAGNOSIS — M25512 Pain in left shoulder: Secondary | ICD-10-CM | POA: Diagnosis not present
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
## 2022-02-23 ENCOUNTER — Other Ambulatory Visit: Payer: Self-pay

## 2022-02-23 ENCOUNTER — Telehealth: Payer: Self-pay

## 2022-02-23 DIAGNOSIS — S42202A Unspecified fracture of upper end of left humerus, initial encounter for closed fracture: Secondary | ICD-10-CM

## 2022-02-23 DIAGNOSIS — G8929 Other chronic pain: Secondary | ICD-10-CM

## 2022-02-23 NOTE — Telephone Encounter (Signed)
Emily [name] pt's medical case manager stated she wanted to know if pt needs to continue PT. If so she needs a new order with recommendations faxed to # [phone]  Her Cb# [phone]  Please advise

## 2022-03-16 ENCOUNTER — Ambulatory Visit (INDEPENDENT_AMBULATORY_CARE_PROVIDER_SITE_OTHER): Payer: Worker's Compensation | Admitting: Orthopaedic Surgery

## 2022-03-16 DIAGNOSIS — S42202A Unspecified fracture of upper end of left humerus, initial encounter for closed fracture: Secondary | ICD-10-CM | POA: Diagnosis not present
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
## 2022-04-19 ENCOUNTER — Ambulatory Visit (INDEPENDENT_AMBULATORY_CARE_PROVIDER_SITE_OTHER): Payer: Worker's Compensation | Admitting: Orthopaedic Surgery

## 2022-04-19 VITALS — BP 130/72 | HR 85 | Ht 67.0 in | Wt 150.0 lb

## 2022-04-19 DIAGNOSIS — S42202A Unspecified fracture of upper end of left humerus, initial encounter for closed fracture: Secondary | ICD-10-CM | POA: Diagnosis not present

## 2022-04-19 NOTE — Addendum Note (Signed)
Addended by: Rogers Seeds on: 04/19/2022 10:53 AM   Modules accepted: Orders
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
## 2022-04-21 ENCOUNTER — Telehealth: Payer: Self-pay

## 2022-04-21 NOTE — Telephone Encounter (Signed)
[facility] in [place] would like a referral faxed  for patient to continue PT.  Patient is WC.  Cb# [phone].  Fax# [phone].  Please advise.  Thank you.

## 2022-04-22 NOTE — Telephone Encounter (Signed)
noted 

## 2022-04-22 NOTE — Telephone Encounter (Signed)
Could you check with [employer] coach on this? Dr. Ophelia Charter gave nurse case manager and patient a copy of rx to continue PT at last visit. Referral has been placed in chart as well. Thanks.

## 2022-04-25 ENCOUNTER — Ambulatory Visit (INDEPENDENT_AMBULATORY_CARE_PROVIDER_SITE_OTHER): Payer: Medicare HMO

## 2022-04-25 DIAGNOSIS — I495 Sick sinus syndrome: Secondary | ICD-10-CM

## 2022-04-27 LAB — CUP PACEART REMOTE DEVICE CHECK
Battery Remaining Longevity: 33 mo
Battery Voltage: 2.97 V
Brady Statistic AP VP Percent: 0.14 %
Brady Statistic AP VS Percent: 33.43 %
Brady Statistic AS VP Percent: 15.34 %
Brady Statistic AS VS Percent: 51.09 %
Brady Statistic RA Percent Paced: 24.56 %
Brady Statistic RV Percent Paced: 15.59 %
Date Time Interrogation Session: 20230829122205
Implantable Lead Implant Date: 20171006
Implantable Lead Implant Date: 20171006
Implantable Lead Location: 753859
Implantable Lead Location: 753860
Implantable Lead Model: 5076
Implantable Lead Model: 5076
Implantable Pulse Generator Implant Date: 20171006
Lead Channel Impedance Value: 323 Ohm
Lead Channel Impedance Value: 380 Ohm
Lead Channel Impedance Value: 437 Ohm
Lead Channel Impedance Value: 437 Ohm
Lead Channel Pacing Threshold Amplitude: 0.625 V
Lead Channel Pacing Threshold Amplitude: 1 V
Lead Channel Pacing Threshold Pulse Width: 0.4 ms
Lead Channel Pacing Threshold Pulse Width: 0.4 ms
Lead Channel Sensing Intrinsic Amplitude: 1.25 mV
Lead Channel Sensing Intrinsic Amplitude: 1.25 mV
Lead Channel Sensing Intrinsic Amplitude: 5.25 mV
Lead Channel Sensing Intrinsic Amplitude: 5.25 mV
Lead Channel Setting Pacing Amplitude: 2 V
Lead Channel Setting Pacing Amplitude: 2.5 V
Lead Channel Setting Pacing Pulse Width: 0.4 ms
Lead Channel Setting Sensing Sensitivity: 0.9 mV

## 2022-04-27 NOTE — Progress Notes (Signed)
Electrophysiology Office Note Date: 04/29/2022  ID:  Emily [name], DOB [birth year], MRN [id number]  PCP: Galvin Proffer, MD Primary Cardiologist: None Electrophysiologist: Emily Range, MD -> Dr. Elberta [name]  CC: Pacemaker follow-up  Emily [name] is a 81 y.o. female seen today for Emily Range, MD for routine electrophysiology followup.  Since last being seen in our clinic the patient reports doing well. She fell in April and broke her arm at work. She is recovering well.  she denies chest pain, dyspnea, PND, orthopnea, nausea, vomiting, dizziness, syncope, edema, weight gain, or early satiety. She does have tachy-palpitations from time to time.   Device History: Medtronic Dual Chamber PPM implanted 06/03/2016 for SSS/Tachy-brady  Past Medical History:  Diagnosis Date   A-fib [facility])    Atrial fibrillation (HCC)    HTN (hypertension)    11/20/12 -ECHO  EF 55-60%   Pacemaker    PVC's (premature ventricular contractions)    Right bundle branch block    Tachy-brady syndrome (HCC)    Past Surgical History:  Procedure Laterality Date   EP IMPLANTABLE DEVICE N/A 06/03/2016   MDT Advisa MRI compatible pacemaker implanted by Dr Emily [name] for post termination pauses with AF.   TONSILLECTOMY      Current Outpatient Medications  Medication Sig Dispense Refill   ALPRAZolam (XANAX) 1 MG tablet Take 1 mg by mouth 2 (two) times daily as needed for anxiety.     Ascorbic Acid (VITAMIN C) 500 MG CAPS Take 1 capsule by mouth daily.     aspirin EC 81 MG tablet Take 81 mg by mouth daily. Swallow whole.     atenolol (TENORMIN) 100 MG tablet Take 100 mg by mouth 2 (two) times daily.     B Complex Vitamins (VITAMIN-B COMPLEX) TABS Take 1 tablet by mouth daily as needed (takes occassionally for vitamin supplementation).      Coenzyme Q10 (COQ10 PO) Take 1 tablet by mouth daily as needed (takes occassionally for vitamin supplementation).      diltiazem (CARDIZEM CD) 240 MG 24 hr capsule Take 1 capsule  (240 mg total) by mouth daily. 90 capsule 2   diltiazem (CARDIZEM) 30 MG tablet Take 30 mg by mouth as directed. Take as needed for heart rate elevation     diltiazem (TIAZAC) 240 MG 24 hr capsule Take 240 mg by mouth daily.     lisinopril (ZESTRIL) 40 MG tablet Take 40 mg by mouth daily.     No current facility-administered medications for this visit.   Allergies:   Patient has no known allergies.   Social History: Social History   Socioeconomic History   Marital status: Married    Spouse name: Not on file   Number of children: 3   Years of education: Not on file   Highest education level: Not on file  Occupational History   Occupation: [employer]  Tobacco Use   Smoking status: Never   Smokeless tobacco: Never  Vaping Use   Vaping Use: Never used  Substance and Sexual Activity   Alcohol use: Yes    Alcohol/week: 1.0 standard drink of alcohol    Types: 1 Glasses of wine per week    Comment: occassional   Drug use: Never   Sexual activity: Not Currently  Other Topics Concern   Not on file  Social History Narrative   ** Merged History Encounter **       Lives in [place].  Cares for her husband who is disabled. Retired  from textiles    Social Determinants of Health   Financial Resource Strain: Not on file  Food Insecurity: Not on file  Transportation Needs: Not on file  Physical Activity: Not on file  Stress: Not on file  Social Connections: Not on file  Intimate Partner Violence: Not on file    Family History: Family History  Problem Relation Age of Onset   Aneurysm Mother    Heart disease Father      Review of Systems: All other systems reviewed and are otherwise negative except as noted above.  Physical Exam: Vitals:   04/29/22 1150  BP: 138/88  Pulse: 76  SpO2: 96%  Weight: 147 lb (66.7 kg)  Height: 5\' 7"  (1.702 m)     GEN- The patient is well appearing, alert and oriented x 3 today.   HEENT: normocephalic, atraumatic; sclera  clear, conjunctiva pink; hearing intact; oropharynx clear; neck supple  Lungs- Clear to ausculation bilaterally, normal work of breathing.  No wheezes, rales, rhonchi Heart- Regular rate and rhythm, no murmurs, rubs or gallops  GI- soft, non-tender, non-distended, bowel sounds present  Extremities- no clubbing or cyanosis. No edema MS- no significant deformity or atrophy Skin- warm and dry, no rash or lesion; PPM pocket well healed Psych- euthymic mood, full affect Neuro- strength and sensation are intact  PPM Interrogation- reviewed in detail today,  See PACEART report  EKG:  EKG is ordered today. Personal review of ekg ordered today shows AF at 70 bpm.    Recent Labs: No results found for requested labs within last 365 days.   Wt Readings from Last 3 Encounters:  04/29/22 147 lb (66.7 kg)  04/19/22 150 lb (68 kg)  01/04/22 140 lb (63.5 kg)     Other studies Reviewed: Additional studies/ records that were reviewed today include: Previous EP office notes, Previous remote checks, Most recent labwork.   Assessment and Plan:  1. Sick sinus syndrome s/p Medtronic PPM  Normal PPM function See Pace Art report No changes today  2. Paroxysmal Atrial Fibrillation  Burden persistent for the last 40 days.  She has continued to refuse OAC despite CHA2DS2VASc of at least 5. Again discussed the new OACs and their relative ease and safety. She continues to refuse.   Labs today.   Current medicines are reviewed at length with the patient today.    Labs/ tests ordered today include:  Orders Placed This Encounter  Procedures   Basic metabolic panel   CBC   EKG 12-Lead   Disposition:   Follow up with Dr. 03/06/22 in 6 months   Signed, Emily Fortis, PA-C  04/29/2022 11:59 AM  [facility] [address] [place] [phone] (office) [phone] (fax)

## 2022-04-29 ENCOUNTER — Ambulatory Visit: Payer: Medicare HMO | Attending: Student | Admitting: Student

## 2022-04-29 ENCOUNTER — Encounter: Payer: Self-pay | Admitting: Student

## 2022-04-29 ENCOUNTER — Encounter: Payer: Medicare HMO | Admitting: Student

## 2022-04-29 VITALS — BP 138/88 | HR 76 | Ht 67.0 in | Wt 147.0 lb

## 2022-04-29 DIAGNOSIS — I495 Sick sinus syndrome: Secondary | ICD-10-CM

## 2022-04-29 DIAGNOSIS — I4891 Unspecified atrial fibrillation: Secondary | ICD-10-CM | POA: Diagnosis not present

## 2022-04-29 DIAGNOSIS — I10 Essential (primary) hypertension: Secondary | ICD-10-CM

## 2022-04-29 NOTE — Patient Instructions (Signed)
Medication Instructions:  Your physician recommends that you continue on your current medications as directed. Please refer to the Current Medication list given to you today.  *If you need a refill on your cardiac medications before your next appointment, please call your pharmacy*   Lab Work: TODAY: CBC, BMET  If you have labs (blood work) drawn today and your tests are completely normal, you will receive your results only by: MyChart Message (if you have MyChart) OR A paper copy in the mail If you have any lab test that is abnormal or we need to change your treatment, we will call you to review the results.   Testing/Procedures: None  Follow-Up: At [facility], you and your health needs are our priority.  As part of our continuing mission to provide you with exceptional heart care, we have created designated Provider Care Teams.  These Care Teams include your primary Cardiologist (physician) and Advanced Practice Providers (APPs -  Physician Assistants and Nurse Practitioners) who all work together to provide you with the care you need, when you need it.  We recommend signing up for the patient portal called "MyChart".  Sign up information is provided on this After Visit Summary.  MyChart is used to connect with patients for Virtual Visits (Telemedicine).  Patients are able to view lab/test results, encounter notes, upcoming appointments, etc.  Non-urgent messages can be sent to your provider as well.   To learn more about what you can do with MyChart, go to [email or web address].    Your next appointment:   6 month(s)  The format for your next appointment:   In Person  Provider:   Loman Brooklyn, MD     Important Information About Sugar

## 2022-04-30 LAB — BASIC METABOLIC PANEL
BUN/Creatinine Ratio: 22 (ref 12–28)
BUN: 23 mg/dL (ref 8–27)
CO2: 24 mmol/L (ref 20–29)
Calcium: 9.5 mg/dL (ref 8.7–10.3)
Chloride: 102 mmol/L (ref 96–106)
Creatinine, Ser: 1.04 mg/dL — ABNORMAL HIGH (ref 0.57–1.00)
Glucose: 100 mg/dL — ABNORMAL HIGH (ref 70–99)
Potassium: 4.7 mmol/L (ref 3.5–5.2)
Sodium: 139 mmol/L (ref 134–144)
eGFR: 54 mL/min/{1.73_m2} — ABNORMAL LOW (ref >59)

## 2022-04-30 LAB — CBC
Hematocrit: 43.6 % (ref 34.0–46.6)
Hemoglobin: 14.5 g/dL (ref 11.1–15.9)
MCH: 30.8 pg (ref 26.6–33.0)
MCHC: 33.3 g/dL (ref 31.5–35.7)
MCV: 93 fL (ref 79–97)
Platelets: 264 10*3/uL (ref 150–450)
RBC: 4.71 x10E6/uL (ref 3.77–5.28)
RDW: 12.3 % (ref 11.7–15.4)
WBC: 8.9 10*3/uL (ref 3.4–10.8)

## 2022-05-19 NOTE — Progress Notes (Signed)
Remote pacemaker transmission.   

## 2022-05-24 ENCOUNTER — Ambulatory Visit (INDEPENDENT_AMBULATORY_CARE_PROVIDER_SITE_OTHER): Payer: Worker's Compensation | Admitting: Orthopaedic Surgery

## 2022-05-24 VITALS — BP 118/77 | HR 89 | Ht 67.0 in | Wt 147.0 lb

## 2022-05-24 DIAGNOSIS — S42202S Unspecified fracture of upper end of left humerus, sequela: Secondary | ICD-10-CM | POA: Diagnosis not present
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
## 2022-07-25 ENCOUNTER — Ambulatory Visit (INDEPENDENT_AMBULATORY_CARE_PROVIDER_SITE_OTHER): Payer: Medicare HMO

## 2022-07-25 DIAGNOSIS — I495 Sick sinus syndrome: Secondary | ICD-10-CM | POA: Diagnosis not present

## 2022-07-26 LAB — CUP PACEART REMOTE DEVICE CHECK
Battery Remaining Longevity: 31 mo
Battery Voltage: 2.96 V
Brady Statistic AP VP Percent: 0.45 %
Brady Statistic AP VS Percent: 4.55 %
Brady Statistic AS VP Percent: 31.6 %
Brady Statistic AS VS Percent: 63.39 %
Brady Statistic RA Percent Paced: 3.3 %
Brady Statistic RV Percent Paced: 32.19 %
Date Time Interrogation Session: 20231127091846
Implantable Lead Connection Status: 753985
Implantable Lead Connection Status: 753985
Implantable Lead Implant Date: 20171006
Implantable Lead Implant Date: 20171006
Implantable Lead Location: 753859
Implantable Lead Location: 753860
Implantable Lead Model: 5076
Implantable Lead Model: 5076
Implantable Pulse Generator Implant Date: 20171006
Lead Channel Impedance Value: 323 Ohm
Lead Channel Impedance Value: 380 Ohm
Lead Channel Impedance Value: 418 Ohm
Lead Channel Impedance Value: 437 Ohm
Lead Channel Pacing Threshold Amplitude: 0.625 V
Lead Channel Pacing Threshold Amplitude: 0.875 V
Lead Channel Pacing Threshold Pulse Width: 0.4 ms
Lead Channel Pacing Threshold Pulse Width: 0.4 ms
Lead Channel Sensing Intrinsic Amplitude: 0.875 mV
Lead Channel Sensing Intrinsic Amplitude: 0.875 mV
Lead Channel Sensing Intrinsic Amplitude: 6 mV
Lead Channel Sensing Intrinsic Amplitude: 6 mV
Lead Channel Setting Pacing Amplitude: 2 V
Lead Channel Setting Pacing Amplitude: 2.5 V
Lead Channel Setting Pacing Pulse Width: 0.4 ms
Lead Channel Setting Sensing Sensitivity: 0.9 mV
Zone Setting Status: 755011
Zone Setting Status: 755011

## 2022-08-09 DIAGNOSIS — I10 Essential (primary) hypertension: Secondary | ICD-10-CM | POA: Diagnosis not present

## 2022-08-09 DIAGNOSIS — I4891 Unspecified atrial fibrillation: Secondary | ICD-10-CM | POA: Diagnosis not present

## 2022-08-09 DIAGNOSIS — E119 Type 2 diabetes mellitus without complications: Secondary | ICD-10-CM | POA: Diagnosis not present

## 2022-08-09 DIAGNOSIS — E038 Other specified hypothyroidism: Secondary | ICD-10-CM | POA: Diagnosis not present

## 2022-08-09 DIAGNOSIS — D518 Other vitamin B12 deficiency anemias: Secondary | ICD-10-CM | POA: Diagnosis not present

## 2022-08-09 DIAGNOSIS — E559 Vitamin D deficiency, unspecified: Secondary | ICD-10-CM | POA: Diagnosis not present

## 2022-08-09 DIAGNOSIS — E782 Mixed hyperlipidemia: Secondary | ICD-10-CM | POA: Diagnosis not present

## 2022-08-12 DIAGNOSIS — F419 Anxiety disorder, unspecified: Secondary | ICD-10-CM | POA: Diagnosis not present

## 2022-09-05 NOTE — Progress Notes (Signed)
Remote pacemaker transmission.   

## 2022-09-08 DIAGNOSIS — I10 Essential (primary) hypertension: Secondary | ICD-10-CM | POA: Diagnosis not present

## 2022-09-08 DIAGNOSIS — E782 Mixed hyperlipidemia: Secondary | ICD-10-CM | POA: Diagnosis not present

## 2022-09-08 DIAGNOSIS — D518 Other vitamin B12 deficiency anemias: Secondary | ICD-10-CM | POA: Diagnosis not present

## 2022-09-08 DIAGNOSIS — E559 Vitamin D deficiency, unspecified: Secondary | ICD-10-CM | POA: Diagnosis not present

## 2022-09-08 DIAGNOSIS — E119 Type 2 diabetes mellitus without complications: Secondary | ICD-10-CM | POA: Diagnosis not present

## 2022-09-08 DIAGNOSIS — I4891 Unspecified atrial fibrillation: Secondary | ICD-10-CM | POA: Diagnosis not present

## 2022-09-08 DIAGNOSIS — E038 Other specified hypothyroidism: Secondary | ICD-10-CM | POA: Diagnosis not present

## 2022-09-13 DIAGNOSIS — F419 Anxiety disorder, unspecified: Secondary | ICD-10-CM | POA: Diagnosis not present

## 2022-09-30 DIAGNOSIS — E782 Mixed hyperlipidemia: Secondary | ICD-10-CM | POA: Diagnosis not present

## 2022-09-30 DIAGNOSIS — D518 Other vitamin B12 deficiency anemias: Secondary | ICD-10-CM | POA: Diagnosis not present

## 2022-09-30 DIAGNOSIS — I4891 Unspecified atrial fibrillation: Secondary | ICD-10-CM | POA: Diagnosis not present

## 2022-09-30 DIAGNOSIS — S23429A Unspecified sprain of sternum, initial encounter: Secondary | ICD-10-CM | POA: Diagnosis not present

## 2022-09-30 DIAGNOSIS — F419 Anxiety disorder, unspecified: Secondary | ICD-10-CM | POA: Diagnosis not present

## 2022-09-30 DIAGNOSIS — I10 Essential (primary) hypertension: Secondary | ICD-10-CM | POA: Diagnosis not present

## 2022-09-30 DIAGNOSIS — I700 Atherosclerosis of aorta: Secondary | ICD-10-CM | POA: Diagnosis not present

## 2022-09-30 DIAGNOSIS — E119 Type 2 diabetes mellitus without complications: Secondary | ICD-10-CM | POA: Diagnosis not present

## 2022-09-30 DIAGNOSIS — E038 Other specified hypothyroidism: Secondary | ICD-10-CM | POA: Diagnosis not present

## 2022-09-30 DIAGNOSIS — E559 Vitamin D deficiency, unspecified: Secondary | ICD-10-CM | POA: Diagnosis not present

## 2022-10-02 LAB — LAB REPORT - SCANNED
A1c: 6.3
EGFR: 50

## 2022-10-07 DIAGNOSIS — E782 Mixed hyperlipidemia: Secondary | ICD-10-CM | POA: Diagnosis not present

## 2022-10-07 DIAGNOSIS — D518 Other vitamin B12 deficiency anemias: Secondary | ICD-10-CM | POA: Diagnosis not present

## 2022-10-07 DIAGNOSIS — E038 Other specified hypothyroidism: Secondary | ICD-10-CM | POA: Diagnosis not present

## 2022-10-07 DIAGNOSIS — I4891 Unspecified atrial fibrillation: Secondary | ICD-10-CM | POA: Diagnosis not present

## 2022-10-07 DIAGNOSIS — E119 Type 2 diabetes mellitus without complications: Secondary | ICD-10-CM | POA: Diagnosis not present

## 2022-10-07 DIAGNOSIS — I10 Essential (primary) hypertension: Secondary | ICD-10-CM | POA: Diagnosis not present

## 2022-10-07 DIAGNOSIS — E559 Vitamin D deficiency, unspecified: Secondary | ICD-10-CM | POA: Diagnosis not present

## 2022-10-18 DIAGNOSIS — Z01818 Encounter for other preprocedural examination: Secondary | ICD-10-CM | POA: Diagnosis not present

## 2022-10-18 DIAGNOSIS — H25813 Combined forms of age-related cataract, bilateral: Secondary | ICD-10-CM | POA: Diagnosis not present

## 2022-10-18 DIAGNOSIS — H25812 Combined forms of age-related cataract, left eye: Secondary | ICD-10-CM | POA: Diagnosis not present

## 2022-10-20 DIAGNOSIS — F419 Anxiety disorder, unspecified: Secondary | ICD-10-CM | POA: Diagnosis not present

## 2022-10-24 ENCOUNTER — Ambulatory Visit: Payer: Medicare HMO

## 2022-10-24 DIAGNOSIS — I495 Sick sinus syndrome: Secondary | ICD-10-CM | POA: Diagnosis not present

## 2022-10-25 LAB — CUP PACEART REMOTE DEVICE CHECK
Battery Remaining Longevity: 26 mo
Battery Voltage: 2.95 V
Brady Statistic RA Percent Paced: 0.17 %
Brady Statistic RV Percent Paced: 43.5 %
Date Time Interrogation Session: 20240226102847
Implantable Lead Connection Status: 753985
Implantable Lead Connection Status: 753985
Implantable Lead Implant Date: 20171006
Implantable Lead Implant Date: 20171006
Implantable Lead Location: 753859
Implantable Lead Location: 753860
Implantable Lead Model: 5076
Implantable Lead Model: 5076
Implantable Pulse Generator Implant Date: 20171006
Lead Channel Impedance Value: 323 Ohm
Lead Channel Impedance Value: 380 Ohm
Lead Channel Impedance Value: 437 Ohm
Lead Channel Impedance Value: 437 Ohm
Lead Channel Pacing Threshold Amplitude: 0.75 V
Lead Channel Pacing Threshold Amplitude: 0.875 V
Lead Channel Pacing Threshold Pulse Width: 0.4 ms
Lead Channel Pacing Threshold Pulse Width: 0.4 ms
Lead Channel Sensing Intrinsic Amplitude: 1.125 mV
Lead Channel Sensing Intrinsic Amplitude: 1.125 mV
Lead Channel Sensing Intrinsic Amplitude: 6 mV
Lead Channel Sensing Intrinsic Amplitude: 6 mV
Lead Channel Setting Pacing Amplitude: 2 V
Lead Channel Setting Pacing Amplitude: 2.5 V
Lead Channel Setting Pacing Pulse Width: 0.4 ms
Lead Channel Setting Sensing Sensitivity: 0.9 mV
Zone Setting Status: 755011
Zone Setting Status: 755011

## 2022-11-02 ENCOUNTER — Encounter: Payer: Medicare HMO | Admitting: Cardiology

## 2022-11-02 ENCOUNTER — Encounter: Payer: Self-pay | Admitting: Cardiology

## 2022-11-02 ENCOUNTER — Ambulatory Visit: Payer: Medicare HMO | Attending: Cardiology | Admitting: Cardiology

## 2022-11-02 VITALS — BP 114/70 | HR 83 | Ht 67.0 in | Wt 153.0 lb

## 2022-11-02 DIAGNOSIS — I495 Sick sinus syndrome: Secondary | ICD-10-CM | POA: Diagnosis not present

## 2022-11-02 DIAGNOSIS — D6869 Other thrombophilia: Secondary | ICD-10-CM

## 2022-11-02 DIAGNOSIS — I4819 Other persistent atrial fibrillation: Secondary | ICD-10-CM | POA: Diagnosis not present

## 2022-11-02 NOTE — Patient Instructions (Signed)
Medication Instructions:  Your physician recommends that you continue on your current medications as directed. Please refer to the Current Medication list given to you today.  *If you need a refill on your cardiac medications before your next appointment, please call your pharmacy*   Lab Work: None ordered   Testing/Procedures: None ordered   Follow-Up: At [facility], you and your health needs are our priority.  As part of our continuing mission to provide you with exceptional heart care, we have created designated Provider Care Teams.  These Care Teams include your primary Cardiologist (physician) and Advanced Practice Providers (APPs -  Physician Assistants and Nurse Practitioners) who all work together to provide you with the care you need, when you need it.  We recommend signing up for the patient portal called "MyChart".  Sign up information is provided on this After Visit Summary.  MyChart is used to connect with patients for Virtual Visits (Telemedicine).  Patients are able to view lab/test results, encounter notes, upcoming appointments, etc.  Non-urgent messages can be sent to your provider as well.   To learn more about what you can do with MyChart, go to [email or web address].    Remote monitoring is used to monitor your Pacemaker or ICD from home. This monitoring reduces the number of office visits required to check your device to one time per year. It allows [place] to keep an eye on the functioning of your device to ensure it is working properly. You are scheduled for a device check from home on 04/24/2023. You may send your transmission at any time that day. If you have a wireless device, the transmission will be sent automatically. After your physician reviews your transmission, you will receive a postcard with your next transmission date.  Your next appointment:   6 month(s)  The format for your next appointment:   In Person  Provider:   Legrand Como "Jonni Sanger" Chalmers Cater, PA-C     Thank you for choosing [facility]!!   Trinidad Curet, RN [phone]    Other Instructions

## 2022-11-02 NOTE — Progress Notes (Signed)
Electrophysiology Office Note   Date:  11/02/2022   ID:  Emily [name], DOB February 26, 1941, MRN [id number]  PCP:  Bonnita Nasuti, MD  Cardiologist:   Primary Electrophysiologist:  Khanh Cordner Meredith Leeds, MD    Chief Complaint: pacemaker   History of Present Illness: Emily [name] is a 82 y.o. female who is being seen today for the evaluation of pacemaker at the request of Hague, Rosalyn Charters, MD. Presenting today for electrophysiology evaluation.  She has a history significant for her atrial fibrillation, hypertension, PVCs, tachybradycardia syndrome.  She is status post Medtronic dual-chamber pacemaker implanted 06/03/2016.  Today, she denies symptoms of palpitations, chest pain, shortness of breath, orthopnea, PND, lower extremity edema, claudication, dizziness, presyncope, syncope, bleeding, or neurologic sequela. The patient is tolerating medications without difficulties.    Past Medical History:  Diagnosis Date   A-fib [facility])    Atrial fibrillation (HCC)    HTN (hypertension)    11/20/12 -ECHO  EF 55-60%   Pacemaker    PVC's (premature ventricular contractions)    Right bundle branch block    Tachy-brady syndrome [facility])    Past Surgical History:  Procedure Laterality Date   EP IMPLANTABLE DEVICE N/A 06/03/2016   MDT Advisa MRI compatible pacemaker implanted by Dr Curt Bears for post termination pauses with AF.   TONSILLECTOMY       Current Outpatient Medications  Medication Sig Dispense Refill   ALPRAZolam (XANAX) 1 MG tablet Take 1 mg by mouth 2 (two) times daily as needed for anxiety.     Ascorbic Acid (VITAMIN C) 500 MG CAPS Take 1 capsule by mouth daily.     aspirin EC 81 MG tablet Take 81 mg by mouth daily. Swallow whole.     atenolol (TENORMIN) 100 MG tablet Take 100 mg by mouth 2 (two) times daily.     B Complex Vitamins (VITAMIN-B COMPLEX) TABS Take 1 tablet by mouth daily as needed (takes occassionally for vitamin supplementation).      Coenzyme Q10 (COQ10 PO) Take 1  tablet by mouth daily as needed (takes occassionally for vitamin supplementation).      diltiazem (CARDIZEM CD) 240 MG 24 hr capsule Take 1 capsule (240 mg total) by mouth daily. 90 capsule 2   diltiazem (CARDIZEM) 30 MG tablet Take 30 mg by mouth as directed. Take as needed for heart rate elevation     lisinopril (ZESTRIL) 40 MG tablet Take 40 mg by mouth daily.     diltiazem (TIAZAC) 240 MG 24 hr capsule Take 240 mg by mouth daily. (Patient not taking: Reported on 11/02/2022)     No current facility-administered medications for this visit.    Allergies:   Patient has no known allergies.   Social History:  The patient  reports that she has never smoked. She has never used smokeless tobacco. She reports current alcohol use of about 1.0 standard drink of alcohol per week. She reports that she does not use drugs.   Family History:  The patient's family history includes Aneurysm in her mother; Heart disease in her father.    ROS:  Please see the history of present illness.   Otherwise, review of systems is positive for none.   All other systems are reviewed and negative.    PHYSICAL EXAM: VS:  BP 114/70   Pulse 83   Ht '5\' 7"'$  (1.702 m)   Wt 153 lb (69.4 kg)   SpO2 98%   BMI 23.96 kg/m  , BMI Body mass  index is 23.96 kg/m. GEN: Well nourished, well developed, in no acute distress  HEENT: normal  Neck: no JVD, carotid bruits, or masses Cardiac: RRR; no murmurs, rubs, or gallops,no edema  Respiratory:  clear to auscultation bilaterally, normal work of breathing GI: soft, nontender, nondistended, + BS MS: no deformity or atrophy  Skin: warm and dry, device pocket is well healed Neuro:  Strength and sensation are intact Psych: euthymic mood, full affect  EKG:  EKG is ordered today. Personal review of the ekg ordered shows AF, V paced  Device interrogation is reviewed today in detail.  See PaceArt for details.   Recent Labs: 04/29/2022: BUN 23; Creatinine, Ser 1.04; Hemoglobin 14.5;  Platelets 264; Potassium 4.7; Sodium 139    Lipid Panel  No results found for: "CHOL", "TRIG", "HDL", "CHOLHDL", "VLDL", "LDLCALC", "LDLDIRECT"   Wt Readings from Last 3 Encounters:  11/02/22 153 lb (69.4 kg)  05/24/22 147 lb (66.7 kg)  04/29/22 147 lb (66.7 kg)      Other studies Reviewed: Additional studies/ records that were reviewed today include: TTE 2016  Review of the above records today demonstrates:  - Left ventricle: The cavity size was normal. Systolic function was    normal. The estimated ejection fraction was in the range of 55%    to 60%.  - Aortic valve: There was mild regurgitation.  - Pulmonary arteries: PA peak pressure: 34 mm Hg (S).    ASSESSMENT AND PLAN:  1.  Sick sinus syndrome: Status post Medtronic dual-chamber pacemaker.  Device functioning appropriately.  Impedance, threshold, sensing within normal limits.  No changes.  2.  Persistent atrial fibrillation: Refuses anticoagulation.  CHA2DS2-VASc of 5.  Koki Buxton plan for rate control.  3.  Secondary hypercoagulable state: Refuses anticoagulation for atrial fibrillation as above    Current medicines are reviewed at length with the patient today.   The patient does not have concerns regarding her medicines.  The following changes were made today:  none  Labs/ tests ordered today include:  Orders Placed This Encounter  Procedures   EKG 12-Lead     Disposition:   FU 6 months  Signed, Zhane Bluitt Meredith Leeds, MD  11/02/2022 11:04 AM     [facility] [address] [place] [address] [phone] (office) [phone] (fax)

## 2022-11-16 DIAGNOSIS — E782 Mixed hyperlipidemia: Secondary | ICD-10-CM | POA: Diagnosis not present

## 2022-11-16 DIAGNOSIS — E038 Other specified hypothyroidism: Secondary | ICD-10-CM | POA: Diagnosis not present

## 2022-11-16 DIAGNOSIS — E7849 Other hyperlipidemia: Secondary | ICD-10-CM | POA: Diagnosis not present

## 2022-11-16 DIAGNOSIS — I4891 Unspecified atrial fibrillation: Secondary | ICD-10-CM | POA: Diagnosis not present

## 2022-11-16 DIAGNOSIS — I10 Essential (primary) hypertension: Secondary | ICD-10-CM | POA: Diagnosis not present

## 2022-11-16 DIAGNOSIS — E559 Vitamin D deficiency, unspecified: Secondary | ICD-10-CM | POA: Diagnosis not present

## 2022-11-16 DIAGNOSIS — I700 Atherosclerosis of aorta: Secondary | ICD-10-CM | POA: Diagnosis not present

## 2022-11-16 DIAGNOSIS — D518 Other vitamin B12 deficiency anemias: Secondary | ICD-10-CM | POA: Diagnosis not present

## 2022-11-16 DIAGNOSIS — E119 Type 2 diabetes mellitus without complications: Secondary | ICD-10-CM | POA: Diagnosis not present

## 2022-12-05 NOTE — Progress Notes (Signed)
Remote pacemaker transmission.   

## 2022-12-06 DIAGNOSIS — H52223 Regular astigmatism, bilateral: Secondary | ICD-10-CM | POA: Diagnosis not present

## 2022-12-06 DIAGNOSIS — I10 Essential (primary) hypertension: Secondary | ICD-10-CM | POA: Diagnosis not present

## 2022-12-06 DIAGNOSIS — H25812 Combined forms of age-related cataract, left eye: Secondary | ICD-10-CM | POA: Diagnosis not present

## 2022-12-06 DIAGNOSIS — H18513 Endothelial corneal dystrophy, bilateral: Secondary | ICD-10-CM | POA: Diagnosis not present

## 2022-12-06 DIAGNOSIS — H53001 Unspecified amblyopia, right eye: Secondary | ICD-10-CM | POA: Diagnosis not present

## 2022-12-06 DIAGNOSIS — H353132 Nonexudative age-related macular degeneration, bilateral, intermediate dry stage: Secondary | ICD-10-CM | POA: Diagnosis not present

## 2022-12-06 DIAGNOSIS — H259 Unspecified age-related cataract: Secondary | ICD-10-CM | POA: Diagnosis not present

## 2022-12-08 ENCOUNTER — Encounter: Payer: Medicare HMO | Admitting: Cardiology

## 2022-12-16 DIAGNOSIS — F419 Anxiety disorder, unspecified: Secondary | ICD-10-CM | POA: Diagnosis not present

## 2022-12-20 DIAGNOSIS — E038 Other specified hypothyroidism: Secondary | ICD-10-CM | POA: Diagnosis not present

## 2022-12-20 DIAGNOSIS — E559 Vitamin D deficiency, unspecified: Secondary | ICD-10-CM | POA: Diagnosis not present

## 2022-12-20 DIAGNOSIS — I10 Essential (primary) hypertension: Secondary | ICD-10-CM | POA: Diagnosis not present

## 2022-12-20 DIAGNOSIS — E782 Mixed hyperlipidemia: Secondary | ICD-10-CM | POA: Diagnosis not present

## 2022-12-20 DIAGNOSIS — I700 Atherosclerosis of aorta: Secondary | ICD-10-CM | POA: Diagnosis not present

## 2022-12-20 DIAGNOSIS — I4891 Unspecified atrial fibrillation: Secondary | ICD-10-CM | POA: Diagnosis not present

## 2022-12-20 DIAGNOSIS — E7849 Other hyperlipidemia: Secondary | ICD-10-CM | POA: Diagnosis not present

## 2022-12-20 DIAGNOSIS — D518 Other vitamin B12 deficiency anemias: Secondary | ICD-10-CM | POA: Diagnosis not present

## 2022-12-20 DIAGNOSIS — E119 Type 2 diabetes mellitus without complications: Secondary | ICD-10-CM | POA: Diagnosis not present

## 2023-01-04 DIAGNOSIS — D649 Anemia, unspecified: Secondary | ICD-10-CM | POA: Diagnosis not present

## 2023-01-04 DIAGNOSIS — Z0000 Encounter for general adult medical examination without abnormal findings: Secondary | ICD-10-CM | POA: Diagnosis not present

## 2023-01-04 DIAGNOSIS — I10 Essential (primary) hypertension: Secondary | ICD-10-CM | POA: Diagnosis not present

## 2023-01-04 DIAGNOSIS — I4891 Unspecified atrial fibrillation: Secondary | ICD-10-CM | POA: Diagnosis not present

## 2023-01-04 DIAGNOSIS — I700 Atherosclerosis of aorta: Secondary | ICD-10-CM | POA: Diagnosis not present

## 2023-01-04 DIAGNOSIS — E038 Other specified hypothyroidism: Secondary | ICD-10-CM | POA: Diagnosis not present

## 2023-01-04 DIAGNOSIS — E559 Vitamin D deficiency, unspecified: Secondary | ICD-10-CM | POA: Diagnosis not present

## 2023-01-04 DIAGNOSIS — E782 Mixed hyperlipidemia: Secondary | ICD-10-CM | POA: Diagnosis not present

## 2023-01-04 DIAGNOSIS — F419 Anxiety disorder, unspecified: Secondary | ICD-10-CM | POA: Diagnosis not present

## 2023-01-05 DIAGNOSIS — R011 Cardiac murmur, unspecified: Secondary | ICD-10-CM | POA: Diagnosis not present

## 2023-01-07 LAB — LAB REPORT - SCANNED
A1c: 5.9
EGFR: 49

## 2023-01-19 DIAGNOSIS — E559 Vitamin D deficiency, unspecified: Secondary | ICD-10-CM | POA: Diagnosis not present

## 2023-01-19 DIAGNOSIS — D518 Other vitamin B12 deficiency anemias: Secondary | ICD-10-CM | POA: Diagnosis not present

## 2023-01-19 DIAGNOSIS — I10 Essential (primary) hypertension: Secondary | ICD-10-CM | POA: Diagnosis not present

## 2023-01-19 DIAGNOSIS — E038 Other specified hypothyroidism: Secondary | ICD-10-CM | POA: Diagnosis not present

## 2023-01-19 DIAGNOSIS — E119 Type 2 diabetes mellitus without complications: Secondary | ICD-10-CM | POA: Diagnosis not present

## 2023-01-19 DIAGNOSIS — E7849 Other hyperlipidemia: Secondary | ICD-10-CM | POA: Diagnosis not present

## 2023-01-19 DIAGNOSIS — E782 Mixed hyperlipidemia: Secondary | ICD-10-CM | POA: Diagnosis not present

## 2023-01-19 DIAGNOSIS — I700 Atherosclerosis of aorta: Secondary | ICD-10-CM | POA: Diagnosis not present

## 2023-01-19 DIAGNOSIS — I4891 Unspecified atrial fibrillation: Secondary | ICD-10-CM | POA: Diagnosis not present

## 2023-01-23 DIAGNOSIS — F419 Anxiety disorder, unspecified: Secondary | ICD-10-CM | POA: Diagnosis not present

## 2023-01-24 ENCOUNTER — Ambulatory Visit: Payer: Medicare HMO | Attending: Cardiology

## 2023-01-24 DIAGNOSIS — I495 Sick sinus syndrome: Secondary | ICD-10-CM

## 2023-01-25 LAB — CUP PACEART REMOTE DEVICE CHECK
Battery Remaining Longevity: 24 mo
Battery Voltage: 2.94 V
Brady Statistic RA Percent Paced: 0.14 %
Brady Statistic RV Percent Paced: 46.3 %
Date Time Interrogation Session: 20240528081723
Implantable Lead Connection Status: 753985
Implantable Lead Connection Status: 753985
Implantable Lead Implant Date: 20171006
Implantable Lead Implant Date: 20171006
Implantable Lead Location: 753859
Implantable Lead Location: 753860
Implantable Lead Model: 5076
Implantable Lead Model: 5076
Implantable Pulse Generator Implant Date: 20171006
Lead Channel Impedance Value: 342 Ohm
Lead Channel Impedance Value: 380 Ohm
Lead Channel Impedance Value: 418 Ohm
Lead Channel Impedance Value: 456 Ohm
Lead Channel Pacing Threshold Amplitude: 0.75 V
Lead Channel Pacing Threshold Amplitude: 0.875 V
Lead Channel Pacing Threshold Pulse Width: 0.4 ms
Lead Channel Pacing Threshold Pulse Width: 0.4 ms
Lead Channel Sensing Intrinsic Amplitude: 1.875 mV
Lead Channel Sensing Intrinsic Amplitude: 1.875 mV
Lead Channel Sensing Intrinsic Amplitude: 5.5 mV
Lead Channel Sensing Intrinsic Amplitude: 5.5 mV
Lead Channel Setting Pacing Amplitude: 2 V
Lead Channel Setting Pacing Amplitude: 2.5 V
Lead Channel Setting Pacing Pulse Width: 0.4 ms
Lead Channel Setting Sensing Sensitivity: 0.9 mV
Zone Setting Status: 755011
Zone Setting Status: 755011

## 2023-01-26 DIAGNOSIS — D2239 Melanocytic nevi of other parts of face: Secondary | ICD-10-CM | POA: Diagnosis not present

## 2023-01-26 DIAGNOSIS — L570 Actinic keratosis: Secondary | ICD-10-CM | POA: Diagnosis not present

## 2023-01-26 DIAGNOSIS — D485 Neoplasm of uncertain behavior of skin: Secondary | ICD-10-CM | POA: Diagnosis not present

## 2023-01-26 DIAGNOSIS — C4441 Basal cell carcinoma of skin of scalp and neck: Secondary | ICD-10-CM | POA: Diagnosis not present

## 2023-01-26 DIAGNOSIS — L820 Inflamed seborrheic keratosis: Secondary | ICD-10-CM | POA: Diagnosis not present

## 2023-01-26 DIAGNOSIS — L814 Other melanin hyperpigmentation: Secondary | ICD-10-CM | POA: Diagnosis not present

## 2023-01-26 DIAGNOSIS — L821 Other seborrheic keratosis: Secondary | ICD-10-CM | POA: Diagnosis not present

## 2023-01-26 DIAGNOSIS — D225 Melanocytic nevi of trunk: Secondary | ICD-10-CM | POA: Diagnosis not present

## 2023-02-20 NOTE — Progress Notes (Signed)
Remote pacemaker transmission.   

## 2023-04-15 LAB — LAB REPORT - SCANNED
A1c: 5.9
EGFR: 50

## 2023-04-24 ENCOUNTER — Ambulatory Visit (INDEPENDENT_AMBULATORY_CARE_PROVIDER_SITE_OTHER): Payer: Medicare HMO

## 2023-04-24 DIAGNOSIS — I495 Sick sinus syndrome: Secondary | ICD-10-CM | POA: Diagnosis not present

## 2023-04-24 LAB — CUP PACEART REMOTE DEVICE CHECK
Battery Remaining Longevity: 21 mo
Battery Voltage: 2.93 V
Brady Statistic RA Percent Paced: 0.18 %
Brady Statistic RV Percent Paced: 48.39 %
Date Time Interrogation Session: 20240826143132
Implantable Lead Connection Status: 753985
Implantable Lead Connection Status: 753985
Implantable Lead Implant Date: 20171006
Implantable Lead Implant Date: 20171006
Implantable Lead Location: 753859
Implantable Lead Location: 753860
Implantable Lead Model: 5076
Implantable Lead Model: 5076
Implantable Pulse Generator Implant Date: 20171006
Lead Channel Impedance Value: 323 Ohm
Lead Channel Impedance Value: 380 Ohm
Lead Channel Impedance Value: 418 Ohm
Lead Channel Impedance Value: 456 Ohm
Lead Channel Pacing Threshold Amplitude: 0.625 V
Lead Channel Pacing Threshold Amplitude: 0.875 V
Lead Channel Pacing Threshold Pulse Width: 0.4 ms
Lead Channel Pacing Threshold Pulse Width: 0.4 ms
Lead Channel Sensing Intrinsic Amplitude: 0.875 mV
Lead Channel Sensing Intrinsic Amplitude: 0.875 mV
Lead Channel Sensing Intrinsic Amplitude: 6.75 mV
Lead Channel Sensing Intrinsic Amplitude: 6.75 mV
Lead Channel Setting Pacing Amplitude: 2 V
Lead Channel Setting Pacing Amplitude: 2.5 V
Lead Channel Setting Pacing Pulse Width: 0.4 ms
Lead Channel Setting Sensing Sensitivity: 0.9 mV
Zone Setting Status: 755011
Zone Setting Status: 755011

## 2023-05-02 ENCOUNTER — Ambulatory Visit: Payer: Medicare HMO | Admitting: Student

## 2023-05-03 NOTE — Progress Notes (Signed)
Remote pacemaker transmission.   

## 2023-05-17 ENCOUNTER — Ambulatory Visit: Payer: Medicare HMO | Attending: Student | Admitting: Pulmonary Disease

## 2023-05-17 ENCOUNTER — Encounter: Payer: Self-pay | Admitting: Student

## 2023-05-17 VITALS — BP 142/90 | HR 69 | Ht 66.0 in | Wt 150.2 lb

## 2023-05-17 DIAGNOSIS — I495 Sick sinus syndrome: Secondary | ICD-10-CM | POA: Diagnosis not present

## 2023-05-17 DIAGNOSIS — I10 Essential (primary) hypertension: Secondary | ICD-10-CM | POA: Diagnosis not present

## 2023-05-17 DIAGNOSIS — I480 Paroxysmal atrial fibrillation: Secondary | ICD-10-CM | POA: Diagnosis not present

## 2023-05-17 LAB — CUP PACEART INCLINIC DEVICE CHECK
Battery Remaining Longevity: 20 mo
Battery Voltage: 2.93 V
Brady Statistic RA Percent Paced: 0.16 %
Brady Statistic RV Percent Paced: 46.83 %
Date Time Interrogation Session: 20240918134933
Implantable Lead Connection Status: 753985
Implantable Lead Connection Status: 753985
Implantable Lead Implant Date: 20171006
Implantable Lead Implant Date: 20171006
Implantable Lead Location: 753859
Implantable Lead Location: 753860
Implantable Lead Model: 5076
Implantable Lead Model: 5076
Implantable Pulse Generator Implant Date: 20171006
Lead Channel Impedance Value: 361 Ohm
Lead Channel Impedance Value: 380 Ohm
Lead Channel Impedance Value: 456 Ohm
Lead Channel Impedance Value: 494 Ohm
Lead Channel Pacing Threshold Amplitude: 0.625 V
Lead Channel Pacing Threshold Amplitude: 0.875 V
Lead Channel Pacing Threshold Pulse Width: 0.4 ms
Lead Channel Pacing Threshold Pulse Width: 0.4 ms
Lead Channel Sensing Intrinsic Amplitude: 0.75 mV
Lead Channel Sensing Intrinsic Amplitude: 1.125 mV
Lead Channel Sensing Intrinsic Amplitude: 6 mV
Lead Channel Sensing Intrinsic Amplitude: 7.25 mV
Lead Channel Setting Pacing Amplitude: 2 V
Lead Channel Setting Pacing Amplitude: 2.5 V
Lead Channel Setting Pacing Pulse Width: 0.4 ms
Lead Channel Setting Sensing Sensitivity: 0.9 mV
Zone Setting Status: 755011
Zone Setting Status: 755011

## 2023-05-17 NOTE — Patient Instructions (Signed)
Medication Instructions:  Call and let us know if you would like to start Eliquis. Your physician recommends that you continue on your current medications as directed. Please refer to the Current Medication list given to you today.  *If you need a refill on your cardiac medications before your next appointment, please call your pharmacy*  Lab Work: None ordered If you have labs (blood work) drawn today and your tests are completely normal, you will receive your results only by: MyChart Message (if you have MyChart) OR A paper copy in the mail If you have any lab test that is abnormal or we need to change your treatment, we will call you to review the results.  Follow-Up: At [facility], you and your health needs are our priority.  As part of our continuing mission to provide you with exceptional heart care, we have created designated Provider Care Teams.  These Care Teams include your primary Cardiologist (physician) and Advanced Practice Providers (APPs -  Physician Assistants and Nurse Practitioners) who all work together to provide you with the care you need, when you need it.  We recommend signing up for the patient portal called "MyChart".  Sign up information is provided on this After Visit Summary.  MyChart is used to connect with patients for Virtual Visits (Telemedicine).  Patients are able to view lab/test results, encounter notes, upcoming appointments, etc.  Non-urgent messages can be sent to your provider as well.   To learn more about what you can do with MyChart, go to [email or web address].    Your next appointment:   6 month(s)  Provider:   Loman Brooklyn, MD

## 2023-05-17 NOTE — Progress Notes (Signed)
Electrophysiology Office Note:   Date:  05/17/2023  ID:  Emily [name], DOB Nov 26, 1940, MRN [id number]  Primary Cardiologist: None Electrophysiologist: Will Jorja Loa, MD      History of Present Illness:   Emily [name] is a 82 y.o. female with h/o paroxysmal AF, PVC's, RBBB, Tachy-brady syndrome s/p PPM, HTN seen for routine electrophysiology followup.   Last EP Clinic visit 04/29/22 for SSS showed normal PPM function.  She had at least 40 days of PAF and continued to refuse OAC.  Labs were stable at that time. Remote device check 04/24/23 showed normal device function per Dr. Elberta Fortis.   Since last being seen in our clinic the patient reports doing "all right". She reports she just lost her dog, Emily [name], and has been terribly sad over the loss.  She lives alone.  Continues to push mow her acre+ yard, active / independent of all ADL's.   She denies chest pain, palpitations, dyspnea, PND, orthopnea, nausea, vomiting, dizziness, syncope, edema, weight gain, or early satiety.   Review of systems complete and found to be negative unless listed in HPI.    EP Information / Studies Reviewed:    EKG is ordered today. Personal review as below.  EKG Interpretation Date/Time:  Wednesday May 17 2023 11:32:03 EDT Ventricular Rate:  69 PR Interval:    QRS Duration:  162 QT Interval:  448 QTC Calculation: 480 R Axis:   -82  Text Interpretation: Ventricular-paced rhythm Confirmed by Canary Brim ([id number]) on 05/17/2023 11:49:36 AM   PPM Interrogation-  reviewed in detail today,  See PACEART report.  Device History: Medtronic Dual Chamber PPM implanted 06/03/2016 for Sinus Node Dysfunction  Studies ECHO 12/2014 > LVEF 55-60%  Risk Assessment/Calculations:    CHA2DS2-VASc Score = 4   This indicates a 4.8% annual risk of stroke. The patient's score is based upon: CHF History: 0 HTN History: 1 Diabetes History: 0 Stroke History: 0 Vascular Disease History: 0 Age Score:  2 Gender Score: 1          Physical Exam:   VS:  BP (!) 142/90   Pulse 69   Ht 5\' 6"  (1.676 m)   Wt 150 lb 3.2 oz (68.1 kg)   SpO2 99%   BMI 24.24 kg/m    Wt Readings from Last 3 Encounters:  05/17/23 150 lb 3.2 oz (68.1 kg)  11/02/22 153 lb (69.4 kg)  05/24/22 147 lb (66.7 kg)     GEN: Well nourished, well developed in no acute distress NECK: No JVD; No carotid bruits CARDIAC: Regular rate and rhythm, no murmurs, rubs, gallops RESPIRATORY:  Clear to auscultation without rales, wheezing or rhonchi  ABDOMEN: Soft, non-tender, non-distended EXTREMITIES:  No edema; No deformity   ASSESSMENT AND PLAN:    Tachy-Brady syndrome s/p Medtronic PPM  RBBB -normal PPM function -see Pace Art report -no changes today  Paroxysmal Atrial Fibrillation  CHA2DS2-VASc 4 / 4.8% annual risk of CVA -discussed risks of CVA with PAF with pt  -she is tearful when discussing > states when it is my time to go it's my time. Asked her to at least consider OAC.  Reviewed Eliquis, Xarelto, Coumadin.  Educated that ASA alone is not enough. Discussed patient assistance options.  -continue atenolol, diltiazem -follow up BMP, CBC   HTN  -continue lisinopril  -BP controlled at home, mild elevation in clinic  Disposition:   Follow up with Dr. Elberta Fortis in 6 months  Signed, Canary Brim, MSN, APRN, NP-C, AGACNP-BC [facility] - Elect[facility]iology  05/17/2023, 2:03 PM

## 2023-07-24 ENCOUNTER — Ambulatory Visit (INDEPENDENT_AMBULATORY_CARE_PROVIDER_SITE_OTHER): Payer: Medicare HMO

## 2023-07-24 DIAGNOSIS — I495 Sick sinus syndrome: Secondary | ICD-10-CM

## 2023-07-24 LAB — CUP PACEART REMOTE DEVICE CHECK
Battery Remaining Longevity: 18 mo
Battery Voltage: 2.92 V
Brady Statistic RA Percent Paced: 0.16 %
Brady Statistic RV Percent Paced: 45.99 %
Date Time Interrogation Session: 20241125104940
Implantable Lead Connection Status: 753985
Implantable Lead Connection Status: 753985
Implantable Lead Implant Date: 20171006
Implantable Lead Implant Date: 20171006
Implantable Lead Location: 753859
Implantable Lead Location: 753860
Implantable Lead Model: 5076
Implantable Lead Model: 5076
Implantable Pulse Generator Implant Date: 20171006
Lead Channel Impedance Value: 342 Ohm
Lead Channel Impedance Value: 399 Ohm
Lead Channel Impedance Value: 437 Ohm
Lead Channel Impedance Value: 456 Ohm
Lead Channel Pacing Threshold Amplitude: 0.625 V
Lead Channel Pacing Threshold Amplitude: 0.875 V
Lead Channel Pacing Threshold Pulse Width: 0.4 ms
Lead Channel Pacing Threshold Pulse Width: 0.4 ms
Lead Channel Sensing Intrinsic Amplitude: 1.125 mV
Lead Channel Sensing Intrinsic Amplitude: 1.125 mV
Lead Channel Sensing Intrinsic Amplitude: 6.625 mV
Lead Channel Sensing Intrinsic Amplitude: 6.625 mV
Lead Channel Setting Pacing Amplitude: 2 V
Lead Channel Setting Pacing Amplitude: 2.5 V
Lead Channel Setting Pacing Pulse Width: 0.4 ms
Lead Channel Setting Sensing Sensitivity: 0.9 mV
Zone Setting Status: 755011
Zone Setting Status: 755011

## 2023-08-21 NOTE — Progress Notes (Signed)
Remote pacemaker transmission.   

## 2023-09-11 DIAGNOSIS — I4891 Unspecified atrial fibrillation: Secondary | ICD-10-CM | POA: Diagnosis not present

## 2023-09-11 DIAGNOSIS — D649 Anemia, unspecified: Secondary | ICD-10-CM | POA: Diagnosis not present

## 2023-09-11 DIAGNOSIS — E559 Vitamin D deficiency, unspecified: Secondary | ICD-10-CM | POA: Diagnosis not present

## 2023-09-11 DIAGNOSIS — I10 Essential (primary) hypertension: Secondary | ICD-10-CM | POA: Diagnosis not present

## 2023-09-21 DIAGNOSIS — F419 Anxiety disorder, unspecified: Secondary | ICD-10-CM | POA: Diagnosis not present

## 2023-10-12 DIAGNOSIS — E559 Vitamin D deficiency, unspecified: Secondary | ICD-10-CM | POA: Diagnosis not present

## 2023-10-12 DIAGNOSIS — I10 Essential (primary) hypertension: Secondary | ICD-10-CM | POA: Diagnosis not present

## 2023-10-12 DIAGNOSIS — D649 Anemia, unspecified: Secondary | ICD-10-CM | POA: Diagnosis not present

## 2023-10-12 DIAGNOSIS — I4891 Unspecified atrial fibrillation: Secondary | ICD-10-CM | POA: Diagnosis not present

## 2023-10-17 DIAGNOSIS — F419 Anxiety disorder, unspecified: Secondary | ICD-10-CM | POA: Diagnosis not present

## 2023-10-17 DIAGNOSIS — D649 Anemia, unspecified: Secondary | ICD-10-CM | POA: Diagnosis not present

## 2023-10-17 DIAGNOSIS — I4891 Unspecified atrial fibrillation: Secondary | ICD-10-CM | POA: Diagnosis not present

## 2023-10-17 DIAGNOSIS — E559 Vitamin D deficiency, unspecified: Secondary | ICD-10-CM | POA: Diagnosis not present

## 2023-10-17 DIAGNOSIS — I10 Essential (primary) hypertension: Secondary | ICD-10-CM | POA: Diagnosis not present

## 2023-10-17 LAB — LAB REPORT - SCANNED
A1c: 6
EGFR: 49

## 2023-10-23 ENCOUNTER — Ambulatory Visit (INDEPENDENT_AMBULATORY_CARE_PROVIDER_SITE_OTHER): Payer: Medicare HMO

## 2023-10-23 DIAGNOSIS — I495 Sick sinus syndrome: Secondary | ICD-10-CM

## 2023-10-25 LAB — CUP PACEART REMOTE DEVICE CHECK
Battery Remaining Longevity: 17 mo
Battery Voltage: 2.92 V
Brady Statistic RA Percent Paced: 0.14 %
Brady Statistic RV Percent Paced: 48.63 %
Date Time Interrogation Session: 20250224130109
Implantable Lead Connection Status: 753985
Implantable Lead Connection Status: 753985
Implantable Lead Implant Date: 20171006
Implantable Lead Implant Date: 20171006
Implantable Lead Location: 753859
Implantable Lead Location: 753860
Implantable Lead Model: 5076
Implantable Lead Model: 5076
Implantable Pulse Generator Implant Date: 20171006
Lead Channel Impedance Value: 342 Ohm
Lead Channel Impedance Value: 399 Ohm
Lead Channel Impedance Value: 437 Ohm
Lead Channel Impedance Value: 456 Ohm
Lead Channel Pacing Threshold Amplitude: 0.625 V
Lead Channel Pacing Threshold Amplitude: 0.875 V
Lead Channel Pacing Threshold Pulse Width: 0.4 ms
Lead Channel Pacing Threshold Pulse Width: 0.4 ms
Lead Channel Sensing Intrinsic Amplitude: 1 mV
Lead Channel Sensing Intrinsic Amplitude: 1 mV
Lead Channel Sensing Intrinsic Amplitude: 6.25 mV
Lead Channel Sensing Intrinsic Amplitude: 6.25 mV
Lead Channel Setting Pacing Amplitude: 2 V
Lead Channel Setting Pacing Amplitude: 2.5 V
Lead Channel Setting Pacing Pulse Width: 0.4 ms
Lead Channel Setting Sensing Sensitivity: 0.9 mV
Zone Setting Status: 755011
Zone Setting Status: 755011

## 2023-10-26 DIAGNOSIS — C4402 Squamous cell carcinoma of skin of lip: Secondary | ICD-10-CM | POA: Diagnosis not present

## 2023-10-26 DIAGNOSIS — L570 Actinic keratosis: Secondary | ICD-10-CM | POA: Diagnosis not present

## 2023-11-07 NOTE — Progress Notes (Unsigned)
  Electrophysiology Office Note:   Date:  11/07/2023  ID:  Emily [name], DOB July 17, 1941, MRN [id number]  Primary Cardiologist: None Primary Heart Failure: None Electrophysiologist: Will Jorja Loa, MD   {Click to update primary MD,subspecialty MD or APP then REFRESH:1}    History of Present Illness:   Emily [name] is a 83 y.o. female with h/o paroxysmal AF, PVC's, RBBB, Tachy-brady syndrome s/p PPM, HTN  seen today for routine electrophysiology followup.   Since last being seen in our clinic the patient reports doing ***.  she denies chest pain, palpitations, dyspnea, PND, orthopnea, nausea, vomiting, dizziness, syncope, edema, weight gain, or early satiety.   Review of systems complete and found to be negative unless listed in HPI.   EP Information / Studies Reviewed:    EKG is not ordered today. EKG from 05/17/2023 reviewed which showed VP 69 bpm      PPM Interrogation-  reviewed in detail today,  See PACEART report.  Device History: Medtronic Dual Chamber PPM implanted 06/03/2016 for Sinus Node Dysfunction  Device History: Medtronic Dual Chamber PPM implanted 06/03/2016 for Sinus Node Dysfunction   Studies ECHO 12/2014 > LVEF 55-60%  Risk Assessment/Calculations:    CHA2DS2-VASc Score = 4  {Confirm score is correct.  If not, click here to update score.  REFRESH note.  :1} This indicates a 4.8% annual risk of stroke. The patient's score is based upon: CHF History: 0 HTN History: 1 Diabetes History: 0 Stroke History: 0 Vascular Disease History: 0 Age Score: 2 Gender Score: 1   {This patient has a significant risk of stroke if diagnosed with atrial fibrillation.  Please consider VKA or DOAC agent for anticoagulation if the bleeding risk is acceptable.   You can also use the SmartPhrase .[email or web address] for documentation.   :[id number]} No BP recorded.  {Refresh Note OR Click here to enter BP  :1}***        Physical Exam:   VS:  There were no vitals taken for this  visit.   Wt Readings from Last 3 Encounters:  05/17/23 150 lb 3.2 oz (68.1 kg)  11/02/22 153 lb (69.4 kg)  05/24/22 147 lb (66.7 kg)     GEN: Well nourished, well developed in no acute distress NECK: No JVD; No carotid bruits CARDIAC: {EPRHYTHM:[id number]}, no murmurs, rubs, gallops RESPIRATORY:  Clear to auscultation without rales, wheezing or rhonchi  ABDOMEN: Soft, non-tender, non-distended EXTREMITIES:  No edema; No deformity   ASSESSMENT AND PLAN:    Tachy-Brady syndrome s/p Medtronic PPM RBBB  -Normal PPM function -See Pace Art report -No changes today  Paroxysmal Atrial Fibrillation  CHA2DS2-VASc 4 / 4.8% annual risk of CVA -continue atenolol, diltiazem -previously declined OAC, last discussed with her in 04/2023 > ***  Hypertension  -well controlled on current regimen ***   Disposition:   Follow up with Dr. Elberta Fortis {EPFOLLOW DG:[id number]}  Signed, Canary Brim, NP-C, AGACNP-BC  [facility] - Elect[facility]iology  11/07/2023, 6:28 PM

## 2023-11-08 DIAGNOSIS — I10 Essential (primary) hypertension: Secondary | ICD-10-CM | POA: Diagnosis not present

## 2023-11-08 DIAGNOSIS — D649 Anemia, unspecified: Secondary | ICD-10-CM | POA: Diagnosis not present

## 2023-11-08 DIAGNOSIS — E559 Vitamin D deficiency, unspecified: Secondary | ICD-10-CM | POA: Diagnosis not present

## 2023-11-08 DIAGNOSIS — I4891 Unspecified atrial fibrillation: Secondary | ICD-10-CM | POA: Diagnosis not present

## 2023-11-13 ENCOUNTER — Ambulatory Visit: Payer: Medicare HMO | Attending: Cardiology | Admitting: Pulmonary Disease

## 2023-11-13 ENCOUNTER — Encounter: Payer: Self-pay | Admitting: Pulmonary Disease

## 2023-11-13 VITALS — BP 126/70 | HR 94 | Ht 66.0 in | Wt 148.0 lb

## 2023-11-13 DIAGNOSIS — I495 Sick sinus syndrome: Secondary | ICD-10-CM

## 2023-11-13 DIAGNOSIS — I10 Essential (primary) hypertension: Secondary | ICD-10-CM

## 2023-11-13 DIAGNOSIS — I480 Paroxysmal atrial fibrillation: Secondary | ICD-10-CM

## 2023-11-13 LAB — CUP PACEART INCLINIC DEVICE CHECK
Battery Remaining Longevity: 16 mo
Battery Voltage: 2.91 V
Brady Statistic RA Percent Paced: 0.16 %
Brady Statistic RV Percent Paced: 47.23 %
Date Time Interrogation Session: 20250317123651
Implantable Lead Connection Status: 753985
Implantable Lead Connection Status: 753985
Implantable Lead Implant Date: 20171006
Implantable Lead Implant Date: 20171006
Implantable Lead Location: 753859
Implantable Lead Location: 753860
Implantable Lead Model: 5076
Implantable Lead Model: 5076
Implantable Pulse Generator Implant Date: 20171006
Lead Channel Impedance Value: 361 Ohm
Lead Channel Impedance Value: 361 Ohm
Lead Channel Impedance Value: 399 Ohm
Lead Channel Impedance Value: 399 Ohm
Lead Channel Impedance Value: 456 Ohm
Lead Channel Impedance Value: 456 Ohm
Lead Channel Impedance Value: 475 Ohm
Lead Channel Impedance Value: 475 Ohm
Lead Channel Pacing Threshold Amplitude: 0.625 V
Lead Channel Pacing Threshold Amplitude: 0.625 V
Lead Channel Pacing Threshold Amplitude: 0.875 V
Lead Channel Pacing Threshold Pulse Width: 0.4 ms
Lead Channel Pacing Threshold Pulse Width: 0.4 ms
Lead Channel Pacing Threshold Pulse Width: 0.4 ms
Lead Channel Sensing Intrinsic Amplitude: 1.5 mV
Lead Channel Sensing Intrinsic Amplitude: 1.5 mV
Lead Channel Sensing Intrinsic Amplitude: 1.875 mV
Lead Channel Sensing Intrinsic Amplitude: 1.875 mV
Lead Channel Sensing Intrinsic Amplitude: 7.5 mV
Lead Channel Sensing Intrinsic Amplitude: 7.5 mV
Lead Channel Sensing Intrinsic Amplitude: 9.25 mV
Lead Channel Sensing Intrinsic Amplitude: 9.25 mV
Lead Channel Setting Pacing Amplitude: 2 V
Lead Channel Setting Pacing Amplitude: 2.5 V
Lead Channel Setting Pacing Pulse Width: 0.4 ms
Lead Channel Setting Sensing Sensitivity: 0.9 mV
Zone Setting Status: 755011
Zone Setting Status: 755011

## 2023-11-13 NOTE — Patient Instructions (Signed)
 Medication Instructions:  Your physician recommends that you continue on your current medications as directed. Please refer to the Current Medication list given to you today.  *If you need a refill on your cardiac medications before your next appointment, please call your pharmacy*  Lab Work: None ordered If you have labs (blood work) drawn today and your tests are completely normal, you will receive your results only by: MyChart Message (if you have MyChart) OR A paper copy in the mail If you have any lab test that is abnormal or we need to change your treatment, we will call you to review the results.  Follow-Up: At [facility], you and your health needs are our priority.  As part of our continuing mission to provide you with exceptional heart care, we have created designated Provider Care Teams.  These Care Teams include your primary Cardiologist (physician) and Advanced Practice Providers (APPs -  Physician Assistants and Nurse Practitioners) who all work together to provide you with the care you need, when you need it.  We recommend signing up for the patient portal called "MyChart".  Sign up information is provided on this After Visit Summary.  MyChart is used to connect with patients for Virtual Visits (Telemedicine).  Patients are able to view lab/test results, encounter notes, upcoming appointments, etc.  Non-urgent messages can be sent to your provider as well.   To learn more about what you can do with MyChart, go to [email or web address].    Your next appointment:   Next available  Provider:   Steffanie Dunn, MD or Nobie Putnam, MD to discuss Watchman  Your next appointment:   1 year(s)  Provider:   You may see Will Jorja Loa, MD or one of the following Advanced Practice Providers on your designated Care Team:   Francis Dowse, [place] [address] [place], [place] Sherie Don, NP Canary Brim, NP

## 2023-11-20 DIAGNOSIS — F419 Anxiety disorder, unspecified: Secondary | ICD-10-CM | POA: Diagnosis not present

## 2023-11-27 DIAGNOSIS — D485 Neoplasm of uncertain behavior of skin: Secondary | ICD-10-CM | POA: Diagnosis not present

## 2023-11-29 NOTE — Progress Notes (Signed)
 Remote pacemaker transmission.

## 2023-11-29 NOTE — Addendum Note (Signed)
 Addended by: Geralyn Flash D on: 11/29/2023 10:47 AM   Modules accepted: Orders

## 2023-12-04 DIAGNOSIS — R350 Frequency of micturition: Secondary | ICD-10-CM | POA: Diagnosis not present

## 2023-12-04 DIAGNOSIS — R3911 Hesitancy of micturition: Secondary | ICD-10-CM | POA: Diagnosis not present

## 2023-12-04 DIAGNOSIS — N399 Disorder of urinary system, unspecified: Secondary | ICD-10-CM | POA: Diagnosis not present

## 2023-12-04 DIAGNOSIS — Z87891 Personal history of nicotine dependence: Secondary | ICD-10-CM | POA: Diagnosis not present

## 2023-12-04 DIAGNOSIS — R339 Retention of urine, unspecified: Secondary | ICD-10-CM | POA: Diagnosis not present

## 2023-12-04 DIAGNOSIS — R3915 Urgency of urination: Secondary | ICD-10-CM | POA: Diagnosis not present

## 2023-12-04 DIAGNOSIS — I4891 Unspecified atrial fibrillation: Secondary | ICD-10-CM | POA: Diagnosis not present

## 2023-12-11 DIAGNOSIS — R197 Diarrhea, unspecified: Secondary | ICD-10-CM | POA: Diagnosis not present

## 2023-12-11 DIAGNOSIS — K649 Unspecified hemorrhoids: Secondary | ICD-10-CM | POA: Diagnosis not present

## 2023-12-11 DIAGNOSIS — F419 Anxiety disorder, unspecified: Secondary | ICD-10-CM | POA: Diagnosis not present

## 2023-12-13 DIAGNOSIS — K080 Exfoliation of teeth due to systemic causes: Secondary | ICD-10-CM | POA: Diagnosis not present

## 2023-12-19 DIAGNOSIS — L570 Actinic keratosis: Secondary | ICD-10-CM | POA: Diagnosis not present

## 2023-12-23 DIAGNOSIS — E559 Vitamin D deficiency, unspecified: Secondary | ICD-10-CM | POA: Diagnosis not present

## 2023-12-23 DIAGNOSIS — I10 Essential (primary) hypertension: Secondary | ICD-10-CM | POA: Diagnosis not present

## 2023-12-23 DIAGNOSIS — D649 Anemia, unspecified: Secondary | ICD-10-CM | POA: Diagnosis not present

## 2023-12-23 DIAGNOSIS — I4891 Unspecified atrial fibrillation: Secondary | ICD-10-CM | POA: Diagnosis not present

## 2023-12-24 NOTE — Progress Notes (Unsigned)
 Electrophysiology Office Follow up Visit Note:    Date:  12/25/2023   ID:  Emily [name], DOB October 10, 1940, MRN [id number]  PCP:  Georgean Kindle, MD  [facility] Cardiologist:  None  [facility] Electrophysiologist:  Will Cortland Ding, MD    Interval History:     Emily [name] is a 83 y.o. female who presents for a follow up visit.   Emily [name] is a 83 year old woman who I am seeing today for EP follow-up.  She last saw Brandi in clinic November 13, 2023.  She has a history of tachycardia-bradycardia syndrome with permanent pacemaker in place.  She has a history that also includes hypertension, atrial fibrillation, PVCs and right bundle branch block.  At the last appointment with Cody Das she expressed an interest in left atrial appendage occlusion as a mechanism to avoid indefinite anticoagulation.  She is a very active woman.  She has been in atrial fibrillation since at least November 2023.  She is doing quite well today.  She is very active.  She does not appreciate her atrial fibrillation symptoms.  When she first was diagnosed the A-fib was more symptomatic.  She is very much opposed to the idea of starting an anticoagulant given the associated risks but she is also fearful of having a stroke.       Past medical, surgical, social and family history were reviewed.  ROS:   Please see the history of present illness.    All other systems reviewed and are negative.  EKGs/Labs/Other Studies Reviewed:    The following studies were reviewed today:         Physical Exam:    VS:  BP 130/82   Pulse 88   Ht 5\' 6"  (1.676 m)   Wt 151 lb (68.5 kg)   SpO2 95%   BMI 24.37 kg/m     Wt Readings from Last 3 Encounters:  12/25/23 151 lb (68.5 kg)  11/13/23 148 lb (67.1 kg)  05/17/23 150 lb 3.2 oz (68.1 kg)     GEN: no distress CARD: Irregularly irregular, No MRG.  CIED pocket well-healed RESP: No IWOB. CTAB.      ASSESSMENT:    1. Tachy-brady syndrome (HCC)    2. Cardiac pacemaker in situ   3. Permanent atrial fibrillation (HCC)    PLAN:    In order of problems listed above:  #Permanent atrial fibrillation Asymptomatic.  Currently on aspirin  81 mg by mouth once daily for stroke prophylaxis.  Has declined indefinite anticoagulation in the past.We discussed left atrial appendage occlusion during today's visit as a mechanism to achieve stroke risk mitigation while avoiding indefinite exposure to anticoagulation.  I discussed the Watchman implant procedure including the risks, recovery and likelihood of success.  She is interested in proceeding with the evaluation.  ------------  I have seen Bonnita [name] in the office today who is being considered for a Watchman left atrial appendage closure device. I believe they will benefit from this procedure given their history of atrial fibrillation, CHA2DS2-VASc score of 4. The patient's chart has been reviewed and I feel that they would be a candidate for short term oral anticoagulation after Watchman implant.   It is my belief that after undergoing a LAA closure procedure, Bonnita [name] will not need long term anticoagulation which eliminates anticoagulation side effects and major bleeding risk.   Procedural risks for the Watchman implant have been reviewed with the patient including a 0.5% risk of stroke, <1% risk  of perforation and <1% risk of device embolization. Other risks include bleeding, vascular damage, tamponade, worsening renal function, and death. The patient understands these risks.  She will think about her options and let us  know if she wants to proceed.   The published clinical data on the safety and effectiveness of WATCHMAN include but are not limited to the following: - Holmes DR, Evalene Hilda, Sick P et al. for the PROTECT AF Investigators. Percutaneous closure of the left atrial appendage versus warfarin therapy for prevention of stroke in patients with atrial fibrillation: a randomised  non-inferiority trial. Lancet 2009; 374: 534-42. Evalene Hilda, Doshi SK, Deloria Fetch D et al. on behalf of the PROTECT AF Investigators. Percutaneous Left Atrial Appendage Closure for Stroke Prophylaxis in Patients With Atrial Fibrillation 2.3-Year Follow-up of the PROTECT AF (Watchman Left Atrial Appendage System for Embolic Protection in Patients With Atrial Fibrillation) Trial. Circulation 2013; 127:720-729. - Alli O, Doshi S,  Kar S, Reddy VY, Sievert H et al. Quality of Life Assessment in the Randomized PROTECT AF (Percutaneous Closure of the Left Atrial Appendage Versus Warfarin Therapy for Prevention of Stroke in Patients With Atrial Fibrillation) Trial of Patients at Risk for Stroke With Nonvalvular Atrial Fibrillation. J Am Coll Cardiol 2013; 61:1790-8. Bartholome Ligas DR, Mario Sicard, Price M, Whisenant B, Sievert H, Doshi S, Huber K, Reddy V. Prospective randomized evaluation of the Watchman left atrial appendage Device in patients with atrial fibrillation versus long-term warfarin therapy; the PREVAIL trial. Journal of the [employer] of Cardiology, Vol. 4, No. 1, 2014, 1-11. - Kar S, Doshi SK, Sadhu A, Horton R, Osorio J et al. Primary outcome evaluation of a next-generation left atrial appendage closure device: results from the PINNACLE FLX trial. Circulation 2021;143(18)1754-1762.   Prior to the procedure, I would like to obtain a gated CT scan of the chest with contrast timed for PV/LA visualization.   Additionally, the patient will need an updated echo.  HAS-BLED score 2 Hypertension Yes  Abnormal renal and liver function (Dialysis, transplant, Cr >2.26 mg/dL /Cirrhosis or Bilirubin >2x Normal or AST/ALT/AP >3x Normal) No  Stroke No  Bleeding No  Labile INR (Unstable/high INR) No  Elderly (>65) Yes  Drugs or alcohol (>= 8 drinks/week, anti-plt or NSAID) No   CHA2DS2-VASc Score = 4  The patient's score is based upon: CHF History: 0 HTN History: 1 Diabetes History: 0 Stroke  History: 0 Vascular Disease History: 0 Age Score: 2 Gender Score: 1   Signed, Harvie Liner, MD, [facility], [facility] 12/25/2023 10:07 AM    Electrophysiology [facility]

## 2023-12-25 ENCOUNTER — Encounter: Payer: Self-pay | Admitting: Cardiology

## 2023-12-25 ENCOUNTER — Ambulatory Visit: Attending: Cardiology | Admitting: Cardiology

## 2023-12-25 VITALS — BP 130/82 | HR 88 | Ht 66.0 in | Wt 151.0 lb

## 2023-12-25 DIAGNOSIS — Z950 Presence of cardiac pacemaker: Secondary | ICD-10-CM

## 2023-12-25 DIAGNOSIS — I4821 Permanent atrial fibrillation: Secondary | ICD-10-CM | POA: Diagnosis not present

## 2023-12-25 DIAGNOSIS — I495 Sick sinus syndrome: Secondary | ICD-10-CM | POA: Diagnosis not present

## 2023-12-25 NOTE — Patient Instructions (Signed)
 Medication Instructions:  Your physician recommends that you continue on your current medications as directed. Please refer to the Current Medication list given to you today.  *If you need a refill on your cardiac medications before your next appointment, please call your pharmacy*  Testing/Procedures: Watchman  Your physician has requested that you have Left atrial appendage (LAA) closure device implantation is a procedure to put a small device in the LAA of the heart. The LAA is a small sac in the wall of the heart's left upper chamber. Blood clots can form in this area. The device, Watchman closes the LAA to help prevent a blood clot and stroke.   Follow-Up: At [facility], you and your health needs are our priority.  As part of our continuing mission to provide you with exceptional heart care, our providers are all part of one team.  This team includes your primary Cardiologist (physician) and Advanced Practice Providers or APPs (Physician Assistants and Nurse Practitioners) who all work together to provide you with the care you need, when you need it.  Your next appointment:   Please reach out if you decide to proceed with Watchman procedure

## 2024-01-23 ENCOUNTER — Ambulatory Visit: Payer: Medicare HMO

## 2024-03-21 ENCOUNTER — Ambulatory Visit (INDEPENDENT_AMBULATORY_CARE_PROVIDER_SITE_OTHER)

## 2024-03-21 DIAGNOSIS — I495 Sick sinus syndrome: Secondary | ICD-10-CM | POA: Diagnosis not present

## 2024-03-22 LAB — CUP PACEART REMOTE DEVICE CHECK
Battery Remaining Longevity: 12 mo
Battery Voltage: 2.9 V
Brady Statistic RA Percent Paced: 0.13 %
Brady Statistic RV Percent Paced: 37.74 %
Date Time Interrogation Session: 20250724174811
Implantable Lead Connection Status: 753985
Implantable Lead Connection Status: 753985
Implantable Lead Implant Date: 20171006
Implantable Lead Implant Date: 20171006
Implantable Lead Location: 753859
Implantable Lead Location: 753860
Implantable Lead Model: 5076
Implantable Lead Model: 5076
Implantable Pulse Generator Implant Date: 20171006
Lead Channel Impedance Value: 342 Ohm
Lead Channel Impedance Value: 399 Ohm
Lead Channel Impedance Value: 437 Ohm
Lead Channel Impedance Value: 456 Ohm
Lead Channel Pacing Threshold Amplitude: 0.625 V
Lead Channel Pacing Threshold Amplitude: 0.875 V
Lead Channel Pacing Threshold Pulse Width: 0.4 ms
Lead Channel Pacing Threshold Pulse Width: 0.4 ms
Lead Channel Sensing Intrinsic Amplitude: 0.75 mV
Lead Channel Sensing Intrinsic Amplitude: 0.75 mV
Lead Channel Sensing Intrinsic Amplitude: 6.5 mV
Lead Channel Sensing Intrinsic Amplitude: 6.5 mV
Lead Channel Setting Pacing Amplitude: 2 V
Lead Channel Setting Pacing Amplitude: 2.5 V
Lead Channel Setting Pacing Pulse Width: 0.4 ms
Lead Channel Setting Sensing Sensitivity: 0.9 mV
Zone Setting Status: 755011
Zone Setting Status: 755011

## 2024-03-25 ENCOUNTER — Ambulatory Visit: Payer: Self-pay | Admitting: Cardiology

## 2024-04-23 ENCOUNTER — Ambulatory Visit: Payer: Medicare HMO

## 2024-05-29 ENCOUNTER — Encounter (HOSPITAL_BASED_OUTPATIENT_CLINIC_OR_DEPARTMENT_OTHER): Payer: Self-pay | Admitting: Family Medicine

## 2024-05-29 ENCOUNTER — Ambulatory Visit (INDEPENDENT_AMBULATORY_CARE_PROVIDER_SITE_OTHER): Admitting: Family Medicine

## 2024-05-29 VITALS — BP 166/103 | HR 81 | Temp 97.7°F | Resp 16 | Ht 64.57 in | Wt 150.6 lb

## 2024-05-29 DIAGNOSIS — I10 Essential (primary) hypertension: Secondary | ICD-10-CM | POA: Diagnosis not present

## 2024-05-29 DIAGNOSIS — I480 Paroxysmal atrial fibrillation: Secondary | ICD-10-CM | POA: Diagnosis not present

## 2024-05-29 DIAGNOSIS — F419 Anxiety disorder, unspecified: Secondary | ICD-10-CM | POA: Diagnosis not present

## 2024-05-29 MED ORDER — ESCITALOPRAM OXALATE 10 MG PO TABS: 10.0000 mg | ORAL_TABLET | Freq: Every day | ORAL | 1 refills | Status: DC

## 2024-05-29 NOTE — Assessment & Plan Note (Signed)
 F/u with [facility] as directed.

## 2024-05-29 NOTE — Progress Notes (Signed)
 New Patient Office Visit  Subjective    Patient ID: Emily [name], female    DOB: [birth year]  Age: 83 y.o. MRN: [id number]  CC:  Chief Complaint  Patient presents with   Establish Care    Establish care     HPI Emily [name] presents to establish care F/u as above.  New to my practice.  She has struggled chronically with anxiety and her previous PCP has written her Xanax for some time.  I'm pleased to hear that she hasn't really tried any other anxiety meds and is open minded to this.  Outpatient Encounter Medications as of 05/29/2024  Medication Sig   ALPRAZolam (XANAX) 1 MG tablet Take 1 mg by mouth 2 (two) times daily as needed for anxiety.   ammonium lactate (LAC-HYDRIN) 12 % lotion APPLY TWICE DAILY TO ARMS AND HANDS TO INCREASE MOISTURE AND REDUCE EASY BRUISING. IT CAN ALSO BE USED SAFELY ON NECK, CHEST AND LEGS IF DESIRED. FACIAL USE IS FINE AS WELL, BUT THE SCENT IS SOMETIMES BOTHERSOME.   Ascorbic Acid (VITAMIN C) 500 MG CAPS Take 1 capsule by mouth as needed.   aspirin  EC 81 MG tablet Take 81 mg by mouth as needed. Swallow whole.   atenolol  (TENORMIN ) 100 MG tablet Take 100 mg by mouth 2 (two) times daily.   B Complex Vitamins (VITAMIN-B COMPLEX) TABS Take 1 tablet by mouth daily as needed (takes occassionally for vitamin supplementation).    diltiazem  (CARDIZEM  CD) 240 MG 24 hr capsule Take 1 capsule (240 mg total) by mouth daily.   diltiazem  (CARDIZEM ) 30 MG tablet Take 30 mg by mouth as directed. Take as needed for heart rate elevation   diltiazem  (TIAZAC ) 240 MG 24 hr capsule Take 240 mg by mouth daily.   escitalopram (LEXAPRO) 10 MG tablet Take 1 tablet (10 mg total) by mouth daily.   lisinopril  (ZESTRIL ) 40 MG tablet Take 40 mg by mouth daily.   No facility-administered encounter medications on file as of 05/29/2024.    Past Medical History:  Diagnosis Date   Anxiety    Atrial fibrillation (HCC)    f/by [facility]   Hypertension    Osteoarthritis     Pacemaker    PVC's (premature ventricular contractions)    Right bundle branch block     Past Surgical History:  Procedure Laterality Date   COLONOSCOPY N/A    2025 per Dr. 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   TONSILLECTOMY      Family History  Problem Relation Age of Onset   Aneurysm Mother    Heart disease Father     Social History   Tobacco Use   Smoking status: Never   Smokeless tobacco: Never  Vaping Use   Vaping status: Never Used  Substance Use Topics   Alcohol use: Yes    Alcohol/week: 1.0 standard drink of alcohol    Types: 1 Glasses of wine per week    Comment: rare   Drug use: Never    Review of Systems  Constitutional:  Negative for diaphoresis, fever, malaise/fatigue and weight loss.  Respiratory:  Negative for cough, shortness of breath and wheezing.   Cardiovascular:  Negative for chest pain, palpitations, orthopnea, claudication, leg swelling and PND.  Psychiatric/Behavioral:  Negative for depression, hallucinations, substance abuse and suicidal ideas. The patient is nervous/anxious. The patient does not have insomnia.  Objective    BP (!) 166/103 (BP Location: Right Arm, Patient Position: Standing)   Pulse 81   Temp 97.7 F (36.5 C) (Oral)   Resp 16   Ht 5' 4.57 (1.64 m)   Wt 150 lb 9.6 oz (68.3 kg)   SpO2 95%   BMI 25.40 kg/m   Physical Exam Constitutional:      General: She is not in acute distress.    Appearance: Normal appearance.  HENT:     Head: Normocephalic.  Neck:     Vascular: No carotid bruit.  Cardiovascular:     Rate and Rhythm: Normal rate and regular rhythm.     Pulses: Normal pulses.     Heart sounds: Normal heart sounds.  Pulmonary:     Effort: Pulmonary effort is normal.     Breath sounds: Normal breath sounds.  Abdominal:     General: Bowel sounds are normal.     Palpations: Abdomen is  soft.  Musculoskeletal:     Cervical back: Neck supple. No tenderness.     Right lower leg: No edema.     Left lower leg: No edema.  Neurological:     Mental Status: She is alert.         Assessment & Plan:  Primary hypertension Assessment & Plan: She states its controlled at home and will follow her per [facility] protocol.  DASH diet.  Request Dr. Teddy records.   Anxiety Assessment & Plan: Uncontrolled.  Benzo dependent.  She isn't sure how many Xanax she still has.  Trial of Lexapro.  I offered Psychiatric referral, especially is she doesn't think she can wean the Xanax some.  She quickly declines this.  She also declines counseling.  Orders: -     Escitalopram Oxalate; Take 1 tablet (10 mg total) by mouth daily.  Dispense: 30 tablet; Refill: 1  Paroxysmal atrial fibrillation [facility]) Assessment & Plan: F/u with [facility] as directed.     Return in about 4 weeks (around 06/26/2024) for chronic follow-up.   REDDING PONCE NORLEEN FALCON., MD

## 2024-05-29 NOTE — Assessment & Plan Note (Signed)
 She states its controlled at home and will follow her per [facility] protocol.  DASH diet.  Request Dr. Teddy records.

## 2024-05-29 NOTE — Assessment & Plan Note (Addendum)
 Uncontrolled.  Benzo dependent.  She isn't sure how many Xanax she still has.  Trial of Lexapro.  I offered Psychiatric referral, especially is she doesn't think she can wean the Xanax some.  She quickly declines this.  She also declines counseling.

## 2024-05-30 NOTE — Progress Notes (Signed)
 Remote PPM Transmission

## 2024-06-20 ENCOUNTER — Ambulatory Visit (INDEPENDENT_AMBULATORY_CARE_PROVIDER_SITE_OTHER)

## 2024-06-20 DIAGNOSIS — I495 Sick sinus syndrome: Secondary | ICD-10-CM | POA: Diagnosis not present

## 2024-06-25 ENCOUNTER — Ambulatory Visit: Payer: Self-pay | Admitting: Cardiology

## 2024-06-25 ENCOUNTER — Telehealth: Payer: Self-pay | Admitting: Cardiology

## 2024-06-25 LAB — CUP PACEART REMOTE DEVICE CHECK
Battery Remaining Longevity: 9 mo
Battery Voltage: 2.88 V
Brady Statistic RA Percent Paced: 0.17 %
Brady Statistic RV Percent Paced: 38.78 %
Date Time Interrogation Session: 20251028133709
Implantable Lead Connection Status: 753985
Implantable Lead Connection Status: 753985
Implantable Lead Implant Date: 20171006
Implantable Lead Implant Date: 20171006
Implantable Lead Location: 753859
Implantable Lead Location: 753860
Implantable Lead Model: 5076
Implantable Lead Model: 5076
Implantable Pulse Generator Implant Date: 20171006
Lead Channel Impedance Value: 342 Ohm
Lead Channel Impedance Value: 380 Ohm
Lead Channel Impedance Value: 437 Ohm
Lead Channel Impedance Value: 456 Ohm
Lead Channel Pacing Threshold Amplitude: 0.625 V
Lead Channel Pacing Threshold Amplitude: 0.875 V
Lead Channel Pacing Threshold Pulse Width: 0.4 ms
Lead Channel Pacing Threshold Pulse Width: 0.4 ms
Lead Channel Sensing Intrinsic Amplitude: 1.25 mV
Lead Channel Sensing Intrinsic Amplitude: 1.25 mV
Lead Channel Sensing Intrinsic Amplitude: 6.625 mV
Lead Channel Sensing Intrinsic Amplitude: 6.625 mV
Lead Channel Setting Pacing Amplitude: 2 V
Lead Channel Setting Pacing Amplitude: 2.5 V
Lead Channel Setting Pacing Pulse Width: 0.4 ms
Lead Channel Setting Sensing Sensitivity: 0.9 mV
Zone Setting Status: 755011
Zone Setting Status: 755011

## 2024-06-25 NOTE — Telephone Encounter (Signed)
 Patient wants a call back regarding her transmission.

## 2024-06-25 NOTE — Telephone Encounter (Signed)
 For Device Team

## 2024-06-26 ENCOUNTER — Encounter (HOSPITAL_BASED_OUTPATIENT_CLINIC_OR_DEPARTMENT_OTHER): Payer: Self-pay | Admitting: Family Medicine

## 2024-06-26 ENCOUNTER — Ambulatory Visit (INDEPENDENT_AMBULATORY_CARE_PROVIDER_SITE_OTHER)

## 2024-06-26 ENCOUNTER — Encounter (HOSPITAL_BASED_OUTPATIENT_CLINIC_OR_DEPARTMENT_OTHER): Payer: Self-pay

## 2024-06-26 ENCOUNTER — Ambulatory Visit (INDEPENDENT_AMBULATORY_CARE_PROVIDER_SITE_OTHER): Admitting: Family Medicine

## 2024-06-26 VITALS — BP 138/77 | HR 73 | Temp 98.1°F | Resp 16 | Wt 145.6 lb

## 2024-06-26 VITALS — BP 138/77 | Ht 67.0 in | Wt 145.0 lb

## 2024-06-26 DIAGNOSIS — Z0000 Encounter for general adult medical examination without abnormal findings: Secondary | ICD-10-CM | POA: Diagnosis not present

## 2024-06-26 DIAGNOSIS — R5381 Other malaise: Secondary | ICD-10-CM

## 2024-06-26 DIAGNOSIS — M8589 Other specified disorders of bone density and structure, multiple sites: Secondary | ICD-10-CM

## 2024-06-26 DIAGNOSIS — I10 Essential (primary) hypertension: Secondary | ICD-10-CM

## 2024-06-26 DIAGNOSIS — F419 Anxiety disorder, unspecified: Secondary | ICD-10-CM | POA: Diagnosis not present

## 2024-06-26 DIAGNOSIS — Z13220 Encounter for screening for lipoid disorders: Secondary | ICD-10-CM

## 2024-06-26 DIAGNOSIS — R5383 Other fatigue: Secondary | ICD-10-CM

## 2024-06-26 HISTORY — DX: Other malaise: R53.81

## 2024-06-26 MED ORDER — ESCITALOPRAM OXALATE 20 MG PO TABS: 20.0000 mg | ORAL_TABLET | Freq: Every day | ORAL | 1 refills | Status: DC

## 2024-06-26 NOTE — Progress Notes (Signed)
 Remote PPM Transmission

## 2024-06-26 NOTE — Progress Notes (Signed)
 Established Patient Office Visit  Subjective   Patient ID: Emily [name], female    DOB: 03-14-1941  Age: 83 y.o. MRN: [id number]  Chief Complaint  Patient presents with   Follow-up    Follow-up    F/u as above.  Please see last note for details.  Has struggled with malaise the last several weeks.  Some vague, minor urinary symptoms noted, but no clear dysuria.  We do have some old records now and can continue getting caught up.  She has tolerated the Lexapro fine, but its hasn't yet helped her anxiety.  She seems agreeable to minimizing her Alprazolam today.    Past Medical History:  Diagnosis Date   Anxiety    Atrial fibrillation (HCC)    f/by [facility]   Hypertension    Kidney stone    Osteoarthritis    Pacemaker    PVC's (premature ventricular contractions)    Right bundle branch block    Skin lesion    f/by [facility]    Outpatient Encounter Medications as of 06/26/2024  Medication Sig   ALPRAZolam (XANAX) 1 MG tablet Take 1 mg by mouth 2 (two) times daily as needed for anxiety.   ammonium lactate (LAC-HYDRIN) 12 % lotion APPLY TWICE DAILY TO ARMS AND HANDS TO INCREASE MOISTURE AND REDUCE EASY BRUISING. IT CAN ALSO BE USED SAFELY ON NECK, CHEST AND LEGS IF DESIRED. FACIAL USE IS FINE AS WELL, BUT THE SCENT IS SOMETIMES BOTHERSOME.   Ascorbic Acid (VITAMIN C) 500 MG CAPS Take 1 capsule by mouth as needed.   aspirin  EC 81 MG tablet Take 81 mg by mouth as needed. Swallow whole.   atenolol  (TENORMIN ) 100 MG tablet Take 100 mg by mouth 2 (two) times daily.   B Complex Vitamins (VITAMIN-B COMPLEX) TABS Take 1 tablet by mouth daily as needed (takes occassionally for vitamin supplementation).    diltiazem  (CARDIZEM  CD) 240 MG 24 hr capsule Take 1 capsule (240 mg total) by mouth daily.   lisinopril  (ZESTRIL ) 40 MG tablet Take 40 mg by mouth daily.   [DISCONTINUED] diltiazem  (TIAZAC ) 240 MG 24 hr capsule Take 240 mg by mouth daily.   [DISCONTINUED]  escitalopram (LEXAPRO) 10 MG tablet Take 1 tablet (10 mg total) by mouth daily.   diltiazem  (CARDIZEM ) 30 MG tablet Take 30 mg by mouth as directed. Take as needed for heart rate elevation (Patient not taking: Reported on 06/26/2024)   escitalopram (LEXAPRO) 20 MG tablet Take 1 tablet (20 mg total) by mouth daily.   No facility-administered encounter medications on file as of 06/26/2024.    Social History   Tobacco Use   Smoking status: Never   Smokeless tobacco: Never  Vaping Use   Vaping status: Never Used  Substance Use Topics   Alcohol use: Yes    Alcohol/week: 1.0 standard drink of alcohol    Types: 1 Glasses of wine per week    Comment: rare   Drug use: Never      Review of Systems  Constitutional:  Positive for malaise/fatigue. Negative for diaphoresis, fever and weight loss.  Respiratory:  Negative for cough, shortness of breath and wheezing.   Cardiovascular:  Negative for chest pain, palpitations, orthopnea, claudication, leg swelling and PND.  Psychiatric/Behavioral:  Positive for depression. Negative for hallucinations, memory loss, substance abuse and suicidal ideas. The patient is nervous/anxious.       Objective:     BP 138/77 (Cuff Size: Normal)   Pulse 73   Temp  98.1 F (36.7 C) (Oral)   Resp 16   Wt 145 lb 9.6 oz (66 kg)   SpO2 95%   BMI 24.56 kg/m    Physical Exam Constitutional:      General: She is not in acute distress.    Appearance: Normal appearance.  HENT:     Head: Normocephalic.  Neck:     Vascular: No carotid bruit.  Cardiovascular:     Rate and Rhythm: Normal rate and regular rhythm.     Pulses: Normal pulses.     Heart sounds: Normal heart sounds.  Pulmonary:     Effort: Pulmonary effort is normal.     Breath sounds: Normal breath sounds.  Abdominal:     General: Bowel sounds are normal.     Palpations: Abdomen is soft.  Musculoskeletal:     Cervical back: Neck supple. No tenderness.     Right lower leg: No edema.      Left lower leg: No edema.  Neurological:     Mental Status: She is alert.      No results found for any visits on 06/26/24.    The ASCVD Risk score (Arnett DK, et al., 2019) failed to calculate for the following reasons:   The 2019 ASCVD risk score is only valid for ages 64 to 22    Assessment & Plan:  Primary hypertension Assessment & Plan: Satisfactory control.  DASH diet.  Orders: -     CBC with Differential/Platelet -     Comprehensive metabolic panel with GFR  Anxiety Assessment & Plan: Lexapro dosage increased and I urged her to alert me if she has problematic side effects.  Orders: -     Escitalopram Oxalate; Take 1 tablet (20 mg total) by mouth daily.  Dispense: 30 tablet; Refill: 1  Malaise and fatigue -     TSH -     Vitamin B12  Lipid screening -     Lipid panel  Osteopenia of multiple sites -     DG Bone Density; Future    Return in about 3 months (around 09/26/2024) for chronic follow-up.    REDDING PONCE NORLEEN FALCON., MD

## 2024-06-26 NOTE — Progress Notes (Signed)
 Because this visit was a virtual/telehealth visit,  certain criteria was not obtained, such a blood pressure, CBG if applicable, and timed get up and go. Any medications not marked as taking were not mentioned during the medication reconciliation part of the visit. Any vitals not documented were not able to be obtained due to this being a telehealth visit or patient was unable to self-report a recent blood pressure reading due to a lack of equipment at home via telehealth. Vitals that have been documented are verbally provided by the patient.  This visit was performed by a medical professional under my direct supervision. I was immediately available for consultation/collaboration. I have reviewed and agree with the Annual Wellness Visit documentation.  Subjective:   Emily [name] is a 83 y.o. who presents for a Medicare Wellness preventive visit.  As a reminder, Annual Wellness Visits don't include a physical exam, and some assessments may be limited, especially if this visit is performed virtually. We may recommend an in-person follow-up visit with your provider if needed.  Visit Complete: Virtual I connected with  Ronal ONEIDA [name] on 06/26/24 by a audio enabled telemedicine application and verified that I am speaking with the correct person using two identifiers.  Patient Location: Home  Provider Location: Home Office  I discussed the limitations of evaluation and management by telemedicine. The patient expressed understanding and agreed to proceed.  Vital Signs: Because this visit was a virtual/telehealth visit, some criteria may be missing or patient reported. Any vitals not documented were not able to be obtained and vitals that have been documented are patient reported.  VideoDeclined- This patient declined Librarian, academic. Therefore the visit was completed with audio only.  Persons Participating in Visit: Patient.  AWV Questionnaire: No: Patient  Medicare AWV questionnaire was not completed prior to this visit.  Cardiac Risk Factors include: advanced age (>55men, >36 women);Other (see comment);hypertension, Risk factor comments: afib     Objective:    Today's Vitals   06/26/24 1411 06/26/24 1412  BP: 138/77   Weight: 145 lb (65.8 kg)   Height: 5' 7 (1.702 m)   PainSc:  0-No pain   Body mass index is 22.71 kg/m.     06/26/2024    2:11 PM 03/25/2021   12:51 AM 03/25/2021   12:50 AM 06/03/2016    7:16 AM 06/01/2016    4:42 PM 04/14/2016    6:45 PM 04/01/2016    4:23 PM  Advanced Directives  Does Patient Have a Medical Advance Directive? No No No No  No  No  No   Would patient like information on creating a medical advance directive? No - Patient declined No - Patient declined No - Patient declined  No - patient declined information  No - patient declined information  No - patient declined information      Data saved with a previous flowsheet row definition    Current Medications (verified) Outpatient Encounter Medications as of 06/26/2024  Medication Sig   ALPRAZolam (XANAX) 1 MG tablet Take 1 mg by mouth 2 (two) times daily as needed for anxiety.   ammonium lactate (LAC-HYDRIN) 12 % lotion APPLY TWICE DAILY TO ARMS AND HANDS TO INCREASE MOISTURE AND REDUCE EASY BRUISING. IT CAN ALSO BE USED SAFELY ON NECK, CHEST AND LEGS IF DESIRED. FACIAL USE IS FINE AS WELL, BUT THE SCENT IS SOMETIMES BOTHERSOME.   Ascorbic Acid (VITAMIN C) 500 MG CAPS Take 1 capsule by mouth as needed.   aspirin  EC  81 MG tablet Take 81 mg by mouth as needed. Swallow whole.   atenolol  (TENORMIN ) 100 MG tablet Take 100 mg by mouth 2 (two) times daily.   B Complex Vitamins (VITAMIN-B COMPLEX) TABS Take 1 tablet by mouth daily as needed (takes occassionally for vitamin supplementation).    diltiazem  (CARDIZEM  CD) 240 MG 24 hr capsule Take 1 capsule (240 mg total) by mouth daily.   escitalopram (LEXAPRO) 20 MG tablet Take 1 tablet (20 mg total) by mouth  daily.   lisinopril  (ZESTRIL ) 40 MG tablet Take 40 mg by mouth daily.   diltiazem  (CARDIZEM ) 30 MG tablet Take 30 mg by mouth as directed. Take as needed for heart rate elevation (Patient not taking: Reported on 06/26/2024)   No facility-administered encounter medications on file as of 06/26/2024.    Allergies (verified) Patient has no known allergies.   History: Past Medical History:  Diagnosis Date   Anxiety    Atrial fibrillation (HCC)    f/by [facility]   Hypertension    Kidney stone    Osteoarthritis    Pacemaker    PVC's (premature ventricular contractions)    Right bundle branch block    Skin lesion    f/by [facility]   Past Surgical History:  Procedure Laterality Date   COLONOSCOPY N/A    2025 per Dr. 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   TONSILLECTOMY     Family History  Problem Relation Age of Onset   Aneurysm Mother    Heart disease Father    Social History   Socioeconomic History   Marital status: Married    Spouse name: Not on file   Number of children: 3   Years of education: Not on file   Highest education level: Not on file  Occupational History   Occupation: [employer]   Occupation: Retired from [employer]  Tobacco Use   Smoking status: Never   Smokeless tobacco: Never  Vaping Use   Vaping status: Never Used  Substance and Sexual Activity   Alcohol use: Yes    Alcohol/week: 1.0 standard drink of alcohol    Types: 1 Glasses of wine per week    Comment: rare   Drug use: Never   Sexual activity: Not Currently  Other Topics Concern   Not on file  Social History Narrative   ** Merged History Encounter **       Lives in [place].  Cares for her husband who is disabled. Retired from designer, fashion/clothing    Social Drivers of Corporate Investment Banker Strain: Low Risk  (06/26/2024)   Overall Financial Resource  Strain (CARDIA)    Difficulty of Paying Living Expenses: Not hard at all  Food Insecurity: No Food Insecurity (06/26/2024)   Hunger Vital Sign    Worried About Running Out of Food in the Last Year: Never true    Ran Out of Food in the Last Year: Never true  Transportation Needs: No Transportation Needs (06/26/2024)   PRAPARE - Administrator, Civil Service (Medical): No    Lack of Transportation (Non-Medical): No  Physical Activity: Sufficiently Active (06/26/2024)   Exercise Vital Sign    Days of Exercise per Week: 7 days    Minutes of Exercise per Session: 30 min  Stress: No Stress Concern Present (06/26/2024)   [employer] of Occupational Health - Occupational Stress Questionnaire  Feeling of Stress: Not at all  Social Connections: Moderately Isolated (06/26/2024)   Social Connection and Isolation Panel    Frequency of Communication with Friends and Family: More than three times a week    Frequency of Social Gatherings with Friends and Family: Three times a week    Attends Religious Services: More than 4 times per year    Active Member of Clubs or Organizations: No    Attends Banker Meetings: Never    Marital Status: Widowed    Tobacco Counseling Counseling given: Not Answered    Clinical Intake:  Pre-visit preparation completed: Yes  Pain : No/denies pain Pain Score: 0-No pain     BMI - recorded: 22.71 Nutritional Status: BMI of 19-24  Normal Nutritional Risks: None Diabetes: No  No results found for: HGBA1C   How often do you need to have someone help you when you read instructions, pamphlets, or other written materials from your doctor or pharmacy?: 1 - Never  Interpreter Needed?: No  Information entered by :: Ariana Cavenaugh,CMA   Activities of Daily Living     06/26/2024    2:14 PM  In your present state of health, do you have any difficulty performing the following activities:  Hearing? 0  Vision? 0  Difficulty  concentrating or making decisions? 0  Walking or climbing stairs? 0  Dressing or bathing? 0  Doing errands, shopping? 0  Preparing Food and eating ? N  Using the Toilet? N  In the past six months, have you accidently leaked urine? N  Do you have problems with loss of bowel control? N  Managing your Medications? N  Managing your Finances? N  Housekeeping or managing your Housekeeping? N    Patient Care Team: Dottie Norleen PHEBE PONCE, MD as PCP - General (Family Medicine) Inocencio Soyla Lunger, MD as PCP - Electrophysiology (Cardiology) Pia Kerney SQUIBB, MD (Internal Medicine)  I have updated your Care Teams any recent Medical Services you may have received from other providers in the past year.     Assessment:   This is a routine wellness examination for [facility].  Hearing/Vision screen Hearing Screening - Comments:: No difficulties Vision Screening - Comments:: No difficulties   Goals Addressed             This Visit's Progress    Patient Stated       To clean home       Depression Screen     06/26/2024    2:16 PM 05/29/2024    9:42 AM  PHQ 2/9 Scores  PHQ - 2 Score 0 0  PHQ- 9 Score 0 0    Fall Risk     06/26/2024    2:13 PM 05/29/2024    9:39 AM  Fall Risk   Falls in the past year? 1 0  Number falls in past yr: 0 0  Injury with Fall? 0 0  Risk for fall due to : No Fall Risks No Fall Risks  Follow up Falls evaluation completed Falls evaluation completed    MEDICARE RISK AT HOME:  Medicare Risk at Home Any stairs in or around the home?: Yes If so, are there any without handrails?: No Home free of loose throw rugs in walkways, pet beds, electrical cords, etc?: Yes Adequate lighting in your home to reduce risk of falls?: Yes Life alert?: No Use of a cane, walker or w/c?: No Grab bars in the bathroom?: Yes Shower chair or bench in shower?: No Elevated  toilet seat or a handicapped toilet?: No  TIMED UP AND GO:  Was the test performed?  no  Cognitive  Function: Declined/Normal: No cognitive concerns noted by patient or family. Patient alert, oriented, able to answer questions appropriately and recall recent events. No signs of memory loss or confusion.        06/26/2024    2:16 PM  6CIT Screen  What Year? 0 points  What month? 0 points  What time? 0 points  Count back from 20 0 points  Months in reverse 0 points  Repeat phrase 0 points  Total Score 0 points    Immunizations Immunization History  Administered Date(s) Administered   INFLUENZA, HIGH DOSE SEASONAL PF 05/20/2018    Screening Tests Health Maintenance  Topic Date Due   Pneumococcal Vaccine: 50+ Years (1 of 2 - PCV) Never done   Zoster Vaccines- Shingrix (1 of 2) Never done   DEXA SCAN  Never done   Influenza Vaccine  03/29/2024   DTaP/Tdap/Td (1 - Tdap) 06/26/2025 (Originally 07/05/1960)   Medicare Annual Wellness (AWV)  06/26/2025   Meningococcal B Vaccine  Aged Out   COVID-19 Vaccine  Discontinued    Health Maintenance Items Addressed:patient declined  Additional Screening:  Vision Screening: Recommended annual ophthalmology exams for early detection of glaucoma and other disorders of the eye. Is the patient up to date with their annual eye exam?  Yes    Dental Screening: Recommended annual dental exams for proper oral hygiene  Community Resource Referral / Chronic Care Management: CRR required this visit?  No   CCM required this visit?  No   Plan:    I have personally reviewed and noted the following in the patient's chart:   Medical and social history Use of alcohol, tobacco or illicit drugs  Current medications and supplements including opioid prescriptions. Patient is not currently taking opioid prescriptions. Functional ability and status Nutritional status Physical activity Advanced directives List of other physicians Hospitalizations, surgeries, and ER visits in previous 12 months Vitals Screenings to include cognitive,  depression, and falls Referrals and appointments  In addition, I have reviewed and discussed with patient certain preventive protocols, quality metrics, and best practice recommendations. A written personalized care plan for preventive services as well as general preventive health recommendations were provided to patient.   Lyle MARLA Right, [place]   06/26/2024   After Visit Summary: (MyChart) Due to this being a telephonic visit, the after visit summary with patients personalized plan was offered to patient via MyChart   Notes: Nothing significant to report at this time.

## 2024-06-26 NOTE — Patient Instructions (Signed)
 Emily [name],  Thank you for taking the time for your Medicare Wellness Visit. I appreciate your continued commitment to your health goals. Please review the care plan we discussed, and feel free to reach out if I can assist you further.  Medicare recommends these wellness visits once per year to help you and your care team stay ahead of potential health issues. These visits are designed to focus on prevention, allowing your provider to concentrate on managing your acute and chronic conditions during your regular appointments.  Please note that Annual Wellness Visits do not include a physical exam. Some assessments may be limited, especially if the visit was conducted virtually. If needed, we may recommend a separate in-person follow-up with your provider.  Ongoing Care Seeing your primary care provider every 3 to 6 months helps us  monitor your health and provide consistent, personalized care.   Referrals If a referral was made during today's visit and you haven't received any updates within two weeks, please contact the referred provider directly to check on the status.  Recommended Screenings:  Health Maintenance  Topic Date Due   Pneumococcal Vaccine for age over 60 (1 of 2 - PCV) Never done   Zoster (Shingles) Vaccine (1 of 2) Never done   DEXA scan (bone density measurement)  Never done   Medicare Annual Wellness Visit  01/04/2024   Flu Shot  03/29/2024   DTaP/Tdap/Td vaccine (1 - Tdap) 06/26/2025*   Meningitis B Vaccine  Aged Out   COVID-19 Vaccine  Discontinued  *Topic was postponed. The date shown is not the original due date.       06/26/2024    2:11 PM  Advanced Directives  Does Patient Have a Medical Advance Directive? No  Would patient like information on creating a medical advance directive? No - Patient declined   Advance Care Planning is important because it: Ensures you receive medical care that aligns with your values, goals, and preferences. Provides guidance  to your family and loved ones, reducing the emotional burden of decision-making during critical moments.  Vision: Annual vision screenings are recommended for early detection of glaucoma, cataracts, and diabetic retinopathy. These exams can also reveal signs of chronic conditions such as diabetes and high blood pressure.  Dental: Annual dental screenings help detect early signs of oral cancer, gum disease, and other conditions linked to overall health, including heart disease and diabetes.  Please see the attached documents for additional preventive care recommendations.

## 2024-06-26 NOTE — Assessment & Plan Note (Signed)
 Satisfactory control.  DASH diet.

## 2024-06-26 NOTE — Assessment & Plan Note (Signed)
 Lexapro dosage increased and I urged her to alert me if she has problematic side effects.

## 2024-06-27 ENCOUNTER — Ambulatory Visit (HOSPITAL_BASED_OUTPATIENT_CLINIC_OR_DEPARTMENT_OTHER): Payer: Self-pay | Admitting: Family Medicine

## 2024-06-27 LAB — LIPID PANEL
Chol/HDL Ratio: 3.1 ratio (ref 0.0–4.4)
Cholesterol, Total: 157 mg/dL (ref 100–199)
HDL: 50 mg/dL (ref >39)
LDL Chol Calc (NIH): 94 mg/dL (ref 0–99)
Triglycerides: 64 mg/dL (ref 0–149)
VLDL Cholesterol Cal: 13 mg/dL (ref 5–40)

## 2024-06-27 LAB — COMPREHENSIVE METABOLIC PANEL WITH GFR
ALT: 13 IU/L (ref 0–32)
AST: 21 IU/L (ref 0–40)
Albumin: 4.3 g/dL (ref 3.7–4.7)
Alkaline Phosphatase: 76 IU/L (ref 48–129)
BUN/Creatinine Ratio: 21 (ref 12–28)
BUN: 24 mg/dL (ref 8–27)
Bilirubin Total: 0.9 mg/dL (ref 0.0–1.2)
CO2: 23 mmol/L (ref 20–29)
Calcium: 9.7 mg/dL (ref 8.7–10.3)
Chloride: 101 mmol/L (ref 96–106)
Creatinine, Ser: 1.14 mg/dL — ABNORMAL HIGH (ref 0.57–1.00)
Globulin, Total: 2.7 g/dL (ref 1.5–4.5)
Glucose: 121 mg/dL — ABNORMAL HIGH (ref 70–99)
Potassium: 4.7 mmol/L (ref 3.5–5.2)
Sodium: 140 mmol/L (ref 134–144)
Total Protein: 7 g/dL (ref 6.0–8.5)
eGFR: 48 mL/min/1.73 — ABNORMAL LOW (ref >59)

## 2024-06-27 LAB — CBC WITH DIFFERENTIAL/PLATELET
Basophils Absolute: 0.1 x10E3/uL (ref 0.0–0.2)
Basos: 1 %
EOS (ABSOLUTE): 0.1 x10E3/uL (ref 0.0–0.4)
Eos: 1 %
Hematocrit: 46.9 % — ABNORMAL HIGH (ref 34.0–46.6)
Hemoglobin: 15.2 g/dL (ref 11.1–15.9)
Immature Grans (Abs): 0 x10E3/uL (ref 0.0–0.1)
Immature Granulocytes: 0 %
Lymphocytes Absolute: 2.1 x10E3/uL (ref 0.7–3.1)
Lymphs: 21 %
MCH: 31 pg (ref 26.6–33.0)
MCHC: 32.4 g/dL (ref 31.5–35.7)
MCV: 96 fL (ref 79–97)
Monocytes Absolute: 0.8 x10E3/uL (ref 0.1–0.9)
Monocytes: 8 %
Neutrophils Absolute: 7.1 x10E3/uL — ABNORMAL HIGH (ref 1.4–7.0)
Neutrophils: 69 %
Platelets: 250 x10E3/uL (ref 150–450)
RBC: 4.91 x10E6/uL (ref 3.77–5.28)
RDW: 12.3 % (ref 11.7–15.4)
WBC: 10.3 x10E3/uL (ref 3.4–10.8)

## 2024-06-27 LAB — VITAMIN B12: Vitamin B-12: 288 pg/mL (ref 232–1245)

## 2024-06-27 LAB — TSH: TSH: 2.02 u[IU]/mL (ref 0.450–4.500)

## 2024-07-23 ENCOUNTER — Ambulatory Visit: Payer: Medicare HMO

## 2024-09-03 ENCOUNTER — Other Ambulatory Visit (HOSPITAL_COMMUNITY): Payer: Self-pay

## 2024-09-04 DIAGNOSIS — M1990 Unspecified osteoarthritis, unspecified site: Secondary | ICD-10-CM | POA: Insufficient documentation

## 2024-09-04 DIAGNOSIS — Z950 Presence of cardiac pacemaker: Secondary | ICD-10-CM | POA: Insufficient documentation

## 2024-09-04 DIAGNOSIS — N200 Calculus of kidney: Secondary | ICD-10-CM | POA: Insufficient documentation

## 2024-09-04 DIAGNOSIS — L989 Disorder of the skin and subcutaneous tissue, unspecified: Secondary | ICD-10-CM | POA: Insufficient documentation

## 2024-09-06 ENCOUNTER — Ambulatory Visit

## 2024-09-06 VITALS — BP 126/72 | HR 81 | Ht 67.0 in | Wt 149.2 lb

## 2024-09-06 DIAGNOSIS — I480 Paroxysmal atrial fibrillation: Secondary | ICD-10-CM

## 2024-09-06 DIAGNOSIS — I495 Sick sinus syndrome: Secondary | ICD-10-CM

## 2024-09-06 DIAGNOSIS — I351 Nonrheumatic aortic (valve) insufficiency: Secondary | ICD-10-CM | POA: Diagnosis not present

## 2024-09-06 DIAGNOSIS — I10 Essential (primary) hypertension: Secondary | ICD-10-CM

## 2024-09-06 MED ORDER — ATENOLOL 100 MG PO TABS: 100.0000 mg | ORAL_TABLET | Freq: Every day | ORAL | 3 refills | Status: AC

## 2024-09-06 MED ORDER — DILTIAZEM HCL ER COATED BEADS 240 MG PO CP24: 240.0000 mg | ORAL_CAPSULE | Freq: Every day | ORAL | 2 refills | Status: AC

## 2024-09-06 MED ORDER — LISINOPRIL 40 MG PO TABS: 40.0000 mg | ORAL_TABLET | Freq: Every day | ORAL | 3 refills | Status: AC

## 2024-09-06 MED ORDER — DILTIAZEM HCL 30 MG PO TABS: 30.0000 mg | ORAL_TABLET | ORAL | 3 refills | Status: AC | PRN

## 2024-09-06 MED ORDER — ASPIRIN 325 MG PO TABS: 325.0000 mg | ORAL_TABLET | ORAL | 3 refills | Status: AC

## 2024-09-06 NOTE — Assessment & Plan Note (Signed)
 Mild aortic insufficiency on prior echocardiogram from May 2016. No follow-up echo available. Will review with repeat echocardiogram.

## 2024-09-06 NOTE — Assessment & Plan Note (Signed)
 Well-controlled. Continue atenolol  100 mg once daily Continue lisinopril  40 mg once daily Diltiazem  240 mg once daily Has additional diltiazem  short acting 30 mg pills available to take for any rate control concerns with A-fib.  Target blood pressure below 140/80 mmHg considering her age.
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
## 2024-09-06 NOTE — Assessment & Plan Note (Signed)
 S/p permanent pacemaker in 2017. Following up with device clinic consistently.

## 2024-09-06 NOTE — Patient Instructions (Signed)
 Medication Instructions:  Your physician recommends that you continue on your current medications as directed. Please refer to the Current Medication list given to you today.  *If you need a refill on your cardiac medications before your next appointment, please call your pharmacy*   Lab Work: None ordered If you have labs (blood work) drawn today and your tests are completely normal, you will receive your results only by: MyChart Message (if you have MyChart) OR A paper copy in the mail If you have any lab test that is abnormal or we need to change your treatment, we will call you to review the results.  Testing/Procedures: Your physician has requested that you have an echocardiogram. Echocardiography is a painless test that uses sound waves to create images of your heart. It provides your doctor with information about the size and shape of your heart and how well your hearts chambers and valves are working. This procedure takes approximately one hour. There are no restrictions for this procedure. Please do NOT wear cologne, perfume, aftershave, or lotions (deodorant is allowed). Please arrive 15 minutes prior to your appointment time.  Please note: We ask at that you not bring children with you during ultrasound (echo/ vascular) testing. Due to room size and safety concerns, children are not allowed in the ultrasound rooms during exams. Our front office staff cannot provide observation of children in our lobby area while testing is being conducted. An adult accompanying a patient to their appointment will only be allowed in the ultrasound room at the discretion of the ultrasound technician under special circumstances. We apologize for any inconvenience.  Follow-Up: At [facility], you and your health needs are our priority.  As part of our continuing mission to provide you with exceptional heart care, we have created designated Provider Care Teams.  These Care Teams include your primary  Cardiologist (physician) and Advanced Practice Providers (APPs -  Physician Assistants and Nurse Practitioners) who all work together to provide you with the care you need, when you need it.  We recommend signing up for the patient portal called MyChart.  Sign up information is provided on this After Visit Summary.  MyChart is used to connect with patients for Virtual Visits (Telemedicine).  Patients are able to view lab/test results, encounter notes, upcoming appointments, etc.  Non-urgent messages can be sent to your provider as well.   To learn more about what you can do with MyChart, go to [email or web address].    Your next appointment:   6 month(s)  The format for your next appointment:   In Person  Provider:      Other Instructions Echocardiogram An echocardiogram is a test that uses sound waves (ultrasound) to produce images of the heart. Images from an echocardiogram can provide important information about: Heart size and shape. The size and thickness and movement of your heart's walls. Heart muscle function and strength. Heart valve function or if you have stenosis. Stenosis is when the heart valves are too narrow. If blood is flowing backward through the heart valves (regurgitation). A tumor or infectious growth around the heart valves. Areas of heart muscle that are not working well because of poor blood flow or injury from a heart attack. Aneurysm detection. An aneurysm is a weak or damaged part of an artery wall. The wall bulges out from the normal force of blood pumping through the body. Tell a health care provider about: Any allergies you have. All medicines you are taking, including vitamins, herbs, eye drops,  creams, and over-the-counter medicines. Any blood disorders you have. Any surgeries you have had. Any medical conditions you have. Whether you are pregnant or may be pregnant. What are the risks? Generally, this is a safe test. However, problems may  occur, including an allergic reaction to dye (contrast) that may be used during the test. What happens before the test? No specific preparation is needed. You may eat and drink normally. What happens during the test? You will take off your clothes from the waist up and put on a hospital gown. Electrodes or electrocardiogram (ECG)patches may be placed on your chest. The electrodes or patches are then connected to a device that monitors your heart rate and rhythm. You will lie down on a table for an ultrasound exam. A gel will be applied to your chest to help sound waves pass through your skin. A handheld device, called a transducer, will be pressed against your chest and moved over your heart. The transducer produces sound waves that travel to your heart and bounce back (or echo back) to the transducer. These sound waves will be captured in real-time and changed into images of your heart that can be viewed on a video monitor. The images will be recorded on a computer and reviewed by your health care provider. You may be asked to change positions or hold your breath for a short time. This makes it easier to get different views or better views of your heart. In some cases, you may receive contrast through an IV in one of your veins. This can improve the quality of the pictures from your heart. The procedure may vary among health care providers and hospitals.   What can I expect after the test? You may return to your normal, everyday life, including diet, activities, and medicines, unless your health care provider tells you not to do that. Follow these instructions at home: It is up to you to get the results of your test. Ask your health care provider, or the department that is doing the test, when your results will be ready. Keep all follow-up visits. This is important. Summary An echocardiogram is a test that uses sound waves (ultrasound) to produce images of the heart. Images from an  echocardiogram can provide important information about the size and shape of your heart, heart muscle function, heart valve function, and other possible heart problems. You do not need to do anything to prepare before this test. You may eat and drink normally. After the echocardiogram is completed, you may return to your normal, everyday life, unless your health care provider tells you not to do that. This information is not intended to replace advice given to you by your health care provider. Make sure you discuss any questions you have with your health care provider. Document Revised: 04/07/2020 Document Reviewed: 04/07/2020 Elsevier Patient Education  2021 Elsevier Inc.   Important Information About Sugar

## 2024-09-06 NOTE — Assessment & Plan Note (Signed)
 Paroxysmal atrial fibrillation episodes CHA2DS2-VASc score 4. Declined anticoagulation due to her concern about risk of bleeding. Had visit with Dr. Cindie for [facility] evaluation, was favorable to proceed but has not made her decision. She mentions she will inform us  when she is ready to proceed with this.  Paroxysmal A-fib episode based on device check. Relatively asymptomatic. Continues with good rate control on current doses of atenolol  and diltiazem . She has additional short acting diltiazem  30 mg pills available to take as needed but mentions she has not required this in over a year.

## 2024-09-19 ENCOUNTER — Ambulatory Visit

## 2024-09-19 DIAGNOSIS — I480 Paroxysmal atrial fibrillation: Secondary | ICD-10-CM | POA: Diagnosis not present

## 2024-09-25 LAB — CUP PACEART REMOTE DEVICE CHECK
Battery Remaining Longevity: 6 mo
Battery Voltage: 2.87 V
Brady Statistic RA Percent Paced: 0.13 %
Brady Statistic RV Percent Paced: 44.42 %
Date Time Interrogation Session: 20260127142839
Implantable Lead Connection Status: 753985
Implantable Lead Connection Status: 753985
Implantable Lead Implant Date: 20171006
Implantable Lead Implant Date: 20171006
Implantable Lead Location: 753859
Implantable Lead Location: 753860
Implantable Lead Model: 5076
Implantable Lead Model: 5076
Implantable Pulse Generator Implant Date: 20171006
Lead Channel Impedance Value: 323 Ohm
Lead Channel Impedance Value: 380 Ohm
Lead Channel Impedance Value: 418 Ohm
Lead Channel Impedance Value: 437 Ohm
Lead Channel Pacing Threshold Amplitude: 0.625 V
Lead Channel Pacing Threshold Amplitude: 0.875 V
Lead Channel Pacing Threshold Pulse Width: 0.4 ms
Lead Channel Pacing Threshold Pulse Width: 0.4 ms
Lead Channel Sensing Intrinsic Amplitude: 0.75 mV
Lead Channel Sensing Intrinsic Amplitude: 0.75 mV
Lead Channel Sensing Intrinsic Amplitude: 6.375 mV
Lead Channel Sensing Intrinsic Amplitude: 6.375 mV
Lead Channel Setting Pacing Amplitude: 2 V
Lead Channel Setting Pacing Amplitude: 2.5 V
Lead Channel Setting Pacing Pulse Width: 0.4 ms
Lead Channel Setting Sensing Sensitivity: 0.9 mV
Zone Setting Status: 755011
Zone Setting Status: 755011

## 2024-09-26 ENCOUNTER — Ambulatory Visit (HOSPITAL_BASED_OUTPATIENT_CLINIC_OR_DEPARTMENT_OTHER): Admitting: Family Medicine

## 2024-09-26 ENCOUNTER — Ambulatory Visit: Payer: Self-pay | Admitting: Cardiology

## 2024-09-26 NOTE — Progress Notes (Signed)
 Remote PPM Transmission

## 2024-10-03 ENCOUNTER — Ambulatory Visit

## 2024-10-22 ENCOUNTER — Ambulatory Visit: Payer: Medicare HMO

## 2024-10-24 ENCOUNTER — Ambulatory Visit

## 2024-10-25 ENCOUNTER — Ambulatory Visit

## 2024-11-07 ENCOUNTER — Ambulatory Visit: Payer: Self-pay | Admitting: Pulmonary Disease

## 2024-11-25 ENCOUNTER — Ambulatory Visit

## 2024-12-19 ENCOUNTER — Ambulatory Visit

## 2025-01-21 ENCOUNTER — Ambulatory Visit: Payer: Medicare HMO

## 2025-03-20 ENCOUNTER — Ambulatory Visit

## 2025-04-22 ENCOUNTER — Ambulatory Visit: Payer: Medicare HMO

## 2025-06-19 ENCOUNTER — Ambulatory Visit

## 2025-07-22 ENCOUNTER — Ambulatory Visit: Payer: Medicare HMO

## 2025-09-18 ENCOUNTER — Ambulatory Visit

## 2025-10-21 ENCOUNTER — Ambulatory Visit: Payer: Medicare HMO

## 2025-12-18 ENCOUNTER — Ambulatory Visit

## 2026-01-20 ENCOUNTER — Ambulatory Visit: Payer: Medicare HMO
